# Patient Record
Sex: Female | Born: 1980 | Race: White | Hispanic: No | Marital: Single | Smoking: Current every day smoker
Health system: Southern US, Community
[De-identification: ages and names within clinical notes are randomized; demographics above are authoritative.]

## PROBLEM LIST (undated history)

## (undated) ENCOUNTER — Emergency Department (HOSPITAL_COMMUNITY): Discharge status: Against Medical Advice | Payer: Medicaid Other

## (undated) DIAGNOSIS — K469 Unspecified abdominal hernia without obstruction or gangrene: Secondary | ICD-10-CM

## (undated) DIAGNOSIS — O139 Gestational [pregnancy-induced] hypertension without significant proteinuria, unspecified trimester: Secondary | ICD-10-CM

## (undated) DIAGNOSIS — N200 Calculus of kidney: Secondary | ICD-10-CM

## (undated) DIAGNOSIS — K5900 Constipation, unspecified: Secondary | ICD-10-CM

## (undated) DIAGNOSIS — R51 Headache: Secondary | ICD-10-CM

## (undated) DIAGNOSIS — J189 Pneumonia, unspecified organism: Secondary | ICD-10-CM

## (undated) DIAGNOSIS — E162 Hypoglycemia, unspecified: Secondary | ICD-10-CM

## (undated) DIAGNOSIS — R0602 Shortness of breath: Secondary | ICD-10-CM

## (undated) DIAGNOSIS — G709 Myoneural disorder, unspecified: Secondary | ICD-10-CM

## (undated) HISTORY — PX: HERNIA REPAIR: SHX51

## (undated) HISTORY — PX: KIDNEY STONE SURGERY: SHX686

## (undated) HISTORY — PX: TUBAL LIGATION: SHX77

---
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
## 1999-04-16 ENCOUNTER — Inpatient Hospital Stay (HOSPITAL_COMMUNITY): Admission: AD | Admit: 1999-04-16 | Discharge: 1999-04-16 | Payer: Self-pay | Admitting: Obstetrics

## 1999-07-11 ENCOUNTER — Emergency Department (HOSPITAL_COMMUNITY): Admission: EM | Admit: 1999-07-11 | Discharge: 1999-07-11 | Payer: Self-pay | Admitting: Emergency Medicine

## 1999-07-12 ENCOUNTER — Emergency Department (HOSPITAL_COMMUNITY): Admission: EM | Admit: 1999-07-12 | Discharge: 1999-07-12 | Payer: Self-pay | Admitting: Emergency Medicine

## 1999-11-22 ENCOUNTER — Encounter: Payer: Self-pay | Admitting: Urology

## 1999-11-22 ENCOUNTER — Emergency Department (HOSPITAL_COMMUNITY): Admission: EM | Admit: 1999-11-22 | Discharge: 1999-11-22 | Payer: Self-pay | Admitting: Emergency Medicine

## 1999-11-24 ENCOUNTER — Ambulatory Visit (HOSPITAL_COMMUNITY): Admission: RE | Admit: 1999-11-24 | Discharge: 1999-11-24 | Payer: Self-pay | Admitting: Urology

## 1999-12-15 ENCOUNTER — Emergency Department (HOSPITAL_COMMUNITY): Admission: EM | Admit: 1999-12-15 | Discharge: 1999-12-15 | Payer: Self-pay | Admitting: Emergency Medicine

## 1999-12-18 ENCOUNTER — Emergency Department (HOSPITAL_COMMUNITY): Admission: EM | Admit: 1999-12-18 | Discharge: 1999-12-18 | Payer: Self-pay

## 2000-03-10 ENCOUNTER — Emergency Department (HOSPITAL_COMMUNITY): Admission: EM | Admit: 2000-03-10 | Discharge: 2000-03-10 | Payer: Self-pay | Admitting: Emergency Medicine

## 2000-06-14 ENCOUNTER — Emergency Department (HOSPITAL_COMMUNITY): Admission: EM | Admit: 2000-06-14 | Discharge: 2000-06-14 | Payer: Self-pay | Admitting: Emergency Medicine

## 2004-04-02 ENCOUNTER — Inpatient Hospital Stay (HOSPITAL_COMMUNITY): Admission: AD | Admit: 2004-04-02 | Discharge: 2004-04-02 | Payer: Self-pay | Admitting: Obstetrics & Gynecology

## 2004-04-17 ENCOUNTER — Inpatient Hospital Stay (HOSPITAL_COMMUNITY): Admission: AD | Admit: 2004-04-17 | Discharge: 2004-04-17 | Payer: Self-pay | Admitting: Obstetrics and Gynecology

## 2004-07-01 ENCOUNTER — Ambulatory Visit (HOSPITAL_COMMUNITY): Admission: RE | Admit: 2004-07-01 | Discharge: 2004-07-01 | Payer: Self-pay | Admitting: *Deleted

## 2004-08-21 ENCOUNTER — Emergency Department (HOSPITAL_COMMUNITY): Admission: EM | Admit: 2004-08-21 | Discharge: 2004-08-21 | Payer: Self-pay | Admitting: Emergency Medicine

## 2004-11-11 ENCOUNTER — Encounter (INDEPENDENT_AMBULATORY_CARE_PROVIDER_SITE_OTHER): Payer: Self-pay | Admitting: Specialist

## 2004-11-11 ENCOUNTER — Ambulatory Visit: Payer: Self-pay | Admitting: Obstetrics and Gynecology

## 2004-11-11 ENCOUNTER — Inpatient Hospital Stay (HOSPITAL_COMMUNITY): Admission: AD | Admit: 2004-11-11 | Discharge: 2004-11-14 | Payer: Self-pay | Admitting: Obstetrics & Gynecology

## 2004-11-17 ENCOUNTER — Inpatient Hospital Stay (HOSPITAL_COMMUNITY): Admission: AD | Admit: 2004-11-17 | Discharge: 2004-11-17 | Payer: Self-pay | Admitting: Obstetrics and Gynecology

## 2005-10-23 ENCOUNTER — Emergency Department (HOSPITAL_COMMUNITY): Admission: EM | Admit: 2005-10-23 | Discharge: 2005-10-23 | Payer: Self-pay | Admitting: Emergency Medicine

## 2007-04-23 ENCOUNTER — Emergency Department (HOSPITAL_COMMUNITY): Admission: EM | Admit: 2007-04-23 | Discharge: 2007-04-24 | Payer: Self-pay | Admitting: Emergency Medicine

## 2008-11-11 ENCOUNTER — Emergency Department (HOSPITAL_COMMUNITY): Admission: EM | Admit: 2008-11-11 | Discharge: 2008-11-11 | Payer: Self-pay | Admitting: Emergency Medicine

## 2009-02-01 ENCOUNTER — Ambulatory Visit: Payer: Self-pay | Admitting: Obstetrics and Gynecology

## 2009-02-01 ENCOUNTER — Inpatient Hospital Stay (HOSPITAL_COMMUNITY): Admission: AD | Admit: 2009-02-01 | Discharge: 2009-02-01 | Payer: Self-pay | Admitting: Obstetrics & Gynecology

## 2009-05-22 ENCOUNTER — Ambulatory Visit (HOSPITAL_COMMUNITY): Admission: RE | Admit: 2009-05-22 | Discharge: 2009-05-22 | Payer: Self-pay | Admitting: Family Medicine

## 2009-06-10 ENCOUNTER — Ambulatory Visit (HOSPITAL_COMMUNITY): Admission: RE | Admit: 2009-06-10 | Discharge: 2009-06-10 | Payer: Self-pay | Admitting: Obstetrics & Gynecology

## 2009-06-17 ENCOUNTER — Ambulatory Visit: Payer: Self-pay | Admitting: Obstetrics & Gynecology

## 2009-06-17 ENCOUNTER — Inpatient Hospital Stay (HOSPITAL_COMMUNITY): Admission: RE | Admit: 2009-06-17 | Discharge: 2009-06-20 | Payer: Self-pay | Admitting: Obstetrics & Gynecology

## 2009-06-24 ENCOUNTER — Ambulatory Visit: Payer: Self-pay | Admitting: Family

## 2009-06-24 ENCOUNTER — Inpatient Hospital Stay (HOSPITAL_COMMUNITY): Admission: AD | Admit: 2009-06-24 | Discharge: 2009-06-24 | Payer: Self-pay | Admitting: Obstetrics and Gynecology

## 2009-07-15 ENCOUNTER — Ambulatory Visit: Payer: Self-pay | Admitting: Family

## 2009-07-15 ENCOUNTER — Inpatient Hospital Stay (HOSPITAL_COMMUNITY): Admission: AD | Admit: 2009-07-15 | Discharge: 2009-07-16 | Payer: Self-pay | Admitting: Obstetrics & Gynecology

## 2009-09-26 ENCOUNTER — Ambulatory Visit: Payer: Self-pay | Admitting: Obstetrics and Gynecology

## 2009-09-26 ENCOUNTER — Inpatient Hospital Stay (HOSPITAL_COMMUNITY): Admission: AD | Admit: 2009-09-26 | Discharge: 2009-09-26 | Payer: Self-pay | Admitting: Obstetrics & Gynecology

## 2009-11-25 ENCOUNTER — Inpatient Hospital Stay (HOSPITAL_COMMUNITY): Admission: AD | Admit: 2009-11-25 | Discharge: 2009-11-25 | Payer: Self-pay | Admitting: Obstetrics and Gynecology

## 2010-03-16 LAB — WET PREP, GENITAL
Trich, Wet Prep: NONE SEEN
Yeast Wet Prep HPF POC: NONE SEEN

## 2010-03-16 LAB — URINALYSIS, ROUTINE W REFLEX MICROSCOPIC
Glucose, UA: NEGATIVE mg/dL
Specific Gravity, Urine: >1.03 — ABNORMAL HIGH (ref 1.005–1.030)
pH: 6 (ref 5.0–8.0)

## 2010-03-16 LAB — GC/CHLAMYDIA PROBE AMP, GENITAL
Chlamydia, DNA Probe: NEGATIVE
GC Probe Amp, Genital: NEGATIVE

## 2010-03-16 LAB — URINE CULTURE: Culture  Setup Time: 201111232255

## 2010-03-16 LAB — URINE MICROSCOPIC-ADD ON

## 2010-03-18 LAB — URINE CULTURE: Culture  Setup Time: 201109241345

## 2010-03-18 LAB — URINALYSIS, ROUTINE W REFLEX MICROSCOPIC
Ketones, ur: NEGATIVE mg/dL
Specific Gravity, Urine: 1.02 (ref 1.005–1.030)

## 2010-03-18 LAB — URINE MICROSCOPIC-ADD ON

## 2010-03-18 LAB — POCT PREGNANCY, URINE: Preg Test, Ur: NEGATIVE

## 2010-03-21 LAB — COMPREHENSIVE METABOLIC PANEL
AST: 11 U/L (ref 0–37)
Albumin: 3.6 g/dL (ref 3.5–5.2)
Alkaline Phosphatase: 129 U/L — ABNORMAL HIGH (ref 39–117)
Calcium: 8.5 mg/dL (ref 8.4–10.5)
Creatinine, Ser: 0.47 mg/dL (ref 0.4–1.2)
Glucose, Bld: 109 mg/dL — ABNORMAL HIGH (ref 70–99)
Potassium: 3.2 mEq/L — ABNORMAL LOW (ref 3.5–5.1)
Sodium: 134 mEq/L — ABNORMAL LOW (ref 135–145)
Total Bilirubin: 0.3 mg/dL (ref 0.3–1.2)

## 2010-03-21 LAB — CBC
HCT: 33.5 % — ABNORMAL LOW (ref 36.0–46.0)
HCT: 36.6 % (ref 36.0–46.0)
MCHC: 34.5 g/dL (ref 30.0–36.0)
MCHC: 34.6 g/dL (ref 30.0–36.0)
MCV: 96.1 fL (ref 78.0–100.0)
Platelets: 245 10*3/uL (ref 150–400)
Platelets: 289 10*3/uL (ref 150–400)
RBC: 3.49 MIL/uL — ABNORMAL LOW (ref 3.87–5.11)
RBC: 2.79 MIL/uL — ABNORMAL LOW (ref 3.87–5.11)
RBC: 3.81 MIL/uL — ABNORMAL LOW (ref 3.87–5.11)
RDW: 13 % (ref 11.5–15.5)
WBC: 13.6 10*3/uL — ABNORMAL HIGH (ref 4.0–10.5)

## 2010-03-21 LAB — RPR: RPR Ser Ql: NONREACTIVE

## 2010-03-21 LAB — URINALYSIS, ROUTINE W REFLEX MICROSCOPIC
Bilirubin Urine: NEGATIVE
Ketones, ur: NEGATIVE mg/dL
Protein, ur: 30 mg/dL — AB
Specific Gravity, Urine: 1.025 (ref 1.005–1.030)
Urobilinogen, UA: 1 mg/dL (ref 0.0–1.0)

## 2010-03-21 LAB — POCT PREGNANCY, URINE: Preg Test, Ur: NEGATIVE

## 2010-03-21 LAB — URINE CULTURE
Colony Count: 80000
Culture  Setup Time: 201107140432

## 2010-03-21 LAB — URINE MICROSCOPIC-ADD ON

## 2010-03-21 LAB — TYPE AND SCREEN
ABO/RH(D): O POS
Antibody Screen: NEGATIVE

## 2010-04-07 LAB — URINALYSIS, ROUTINE W REFLEX MICROSCOPIC
Glucose, UA: NEGATIVE mg/dL
Ketones, ur: NEGATIVE mg/dL
Protein, ur: NEGATIVE mg/dL

## 2010-04-07 LAB — CBC
HCT: 38.5 % (ref 36.0–46.0)
Hemoglobin: 13.4 g/dL (ref 12.0–15.0)
MCV: 90.4 fL (ref 78.0–100.0)
Platelets: 343 10*3/uL (ref 150–400)
RBC: 4.26 MIL/uL (ref 3.87–5.11)
WBC: 7 10*3/uL (ref 4.0–10.5)

## 2010-04-07 LAB — GC/CHLAMYDIA PROBE AMP, GENITAL: Chlamydia, DNA Probe: NEGATIVE

## 2010-04-07 LAB — BASIC METABOLIC PANEL
BUN: 5 mg/dL — ABNORMAL LOW (ref 6–23)
Chloride: 101 mEq/L (ref 96–112)
Potassium: 3.4 mEq/L — ABNORMAL LOW (ref 3.5–5.1)

## 2010-04-07 LAB — URINE MICROSCOPIC-ADD ON

## 2010-04-07 LAB — DIFFERENTIAL
Eosinophils Absolute: 0.1 10*3/uL (ref 0.0–0.7)
Eosinophils Relative: 1 % (ref 0–5)
Lymphs Abs: 2.3 10*3/uL (ref 0.7–4.0)
Monocytes Absolute: 0.3 10*3/uL (ref 0.1–1.0)
Monocytes Relative: 5 % (ref 3–12)

## 2010-04-07 LAB — WET PREP, GENITAL: Clue Cells Wet Prep HPF POC: NONE SEEN

## 2010-05-21 NOTE — Discharge Summary (Signed)
NAMEBERNEDA, Lindsey Sparks NO.:  [id number]   MEDICAL RECORD NO.:  [id number]          PATIENT TYPE:  INP   LOCATION:  9132                          FACILITY:  [facility]   PHYSICIAN:  Lesly Dukes, M.D. DATE OF BIRTH:  [birth year]   DATE OF ADMISSION:  11/11/2004  DATE OF DISCHARGE:  11/14/2004                                 DISCHARGE SUMMARY   DISCHARGE DIAGNOSES:  1.  Repeat low transverse Cesarean section for intrauterine pregnancy.  2.  Term pregnancy, viable female delivered via repeat low transverse Cesarean      section.  3.  Cephalopelvic disproportion.  4.  Attempted vaginal birth after Cesarean section but patient desired      repeat Cesarean section at 37-1/7 weeks.   DISCHARGE MEDICATIONS:  1.  Ibuprofen 600 mg one tablet p.o. q.6h. PRN pain.  2.  Percocet 5/325 one tablet p.o. q.6h. PRN pain.  3.  Prenatal vitamins one tablet daily.  4.  Colace 100 mg one tablet p.o. daily.   PROCEDURES:  Repeat low transverse Cesarean section performed on November 11, 2004 by Dr. Elinor Dodge.  Please see the operative report dated November 11, 2004 for further details.   BRIEF HOSPITAL COURSE:  The patient is a 30 year old G2, P1 at 37-1/7 weeks  who attempted VBAC delivery today who decided to have a repeat Cesarean  section in light of her previous cephalopelvic disproportion and her  __________  pelvis.  The patient was brought back for repeat low transverse  Cesarean section and went on to delivery of a viable female infant with  Apgar's of 7 and 8.  Placenta was manually delivered with three vessel cord  intact.  It was noted that the patient did have significant scarring in her  abdomen and she did not have a layer of peritoneum and she would likely  benefit from a vertical incision with her next Cesarean section.  The  delivery was also a vacuum-assisted Cesarean section delivery.  The patient  was then taken to the PACU and then recovered well and continued on  postpartum mother-baby unit.  The patient did well, tolerated pain fairly  well.  Staples were removed on discharge date and Steri-Strips were placed.  Her bleeding was decreased.  Her pain was well controlled with ibuprofen and  Percocet.  The patient is breastfeeding and will be using condoms for birth  control.  The patient will return to [facility] in six weeks for  postpartum checks.  The patient's vital signs are stable on the date of  discharge and her uterus is firm below the umbilicus.  The patient was also  counseled on her desire to want to have future pregnancies and that she  should understand the risks associated with the fact that her next Cesarean  section would possibly need to be using a vertical incision and that may  pose a lot of risk.  The patient was counseled on talking to her OB  physician prior to becoming pregnant on her next occasion.   The patient's Rubella status was non-immune and the patient will  be given an  injection of Rubella vaccine prior to discharge.  Syphilis was nonreactive,  HIV nonreactive, Gonorrhea and Chlamydia negative.  Hemoglobin was 9.5 with  hematocrit of 27.4 and platelet count 275,000 on date of discharge.  Blood  type was O __________  and hepatitis B surface antigen was negative.   The patient had pregnancy-induced hypertension labs drawn due to the fact  that she had some slight elevations in her blood pressure during this  hospitalization which revealed they were within normal limits.   FOLLOW UP:  The patient is to [facility] with [facility] weeks.      Barth Kirks, M.D.    ______________________________  Lesly Dukes, M.D.    MB/MEDQ  D:  11/14/2004  T:  11/14/2004  Job:  [id number]

## 2010-05-21 NOTE — Op Note (Signed)
NAMECHRISHAWN, KRING NO.:  [id number]   MEDICAL RECORD NO.:  [id number]          PATIENT TYPE:  INP   LOCATION:  9132                          [facility]ACILITY:  [facility]   PHYSICIAN:  Lesly Dukes, M.D. DATE OF BIRTH:  1980-08-01   DATE OF PROCEDURE:  11/11/2004  DATE OF DISCHARGE:                                 OPERATIVE REPORT   ADDENDUM:  report #[id number]   Patient did have significant scarring in her abdomen.  Patient did not have  a layer of peritoneum.  She would likely benefit from a vertical incision  with her next cesarean section.     ______________________________  August Saucer Merlene Morse, MD    ______________________________  Lesly Dukes, M.D.    ABC/MEDQ  D:  11/12/2004  T:  11/12/2004  Job:  [id number]

## 2010-05-21 NOTE — Op Note (Signed)
Lindsey Sparks, Lindsey Sparks             ACCOUNT NO.:  [id number]   MEDICAL RECORD NO.:  [id number]          PATIENT TYPE:  INP   LOCATION:  9132                          [facility]ILITY[facility]  [facility]   PHYSICIAN:  Phil D. Okey Dupre, M.D.     DATE OF BIRTH:  [birth year]   DATE OF PROCEDURE:  11/11/2004  DATE OF DISCHARGE:                                 OPERATIVE REPORT   PREOPERATIVE DIAGNOSES:  1.  Term intrauterine pregnancy at 37-1/7 weeks.  2.  Cephalopelvic disproportion.  3.  Attempted vaginal birth after cesarean section but the patient desired      repeat cesarean section.   POSTOPERATIVE DIAGNOSES:  1.  Term intrauterine pregnancy at 37-1/7 weeks.  2.  Cephalopelvic disproportion.  3.  Attempted vaginal birth after cesarean section but the patient desired      repeat cesarean section.   OPERATION/PROCEDURE:  Repeat low transverse cesarean section per  Pfannenstiel.   SURGEON:  Javier Glazier. Okey Dupre, M.D.   ASSISTANTKaroline Caldwell B. Merlene Morse, M.D.   ANESTHESIA:  Epidural.   COMPLICATIONS:  None.   ESTIMATED BLOOD LOSS:  850 mL.   IV FLUIDS:  2300 mL.   URINARY OUTPUT:  200 mL at the end of the procedure.   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  Maximum dilatation  1-2 cm.   FINDINGS:  A female infant in cephalic presentation.  Pediatrics present at  delivery.  Apgars 7 and 8.  Normal uterus, tubes and ovaries.   DESCRIPTION OF PROCEDURE:  The patient was taken to the operating room where  epidural anesthesia was found to be adequate.  The patient was prepped and  draped in the normal sterile fashion, placed in the dorsal supine position  with a leftward tilt.  A Pfannenstiel skin incision was then made with the  scalpel and carried through to the underlying layer of fascia.  The fascia  was incised in the midline and incision  extended laterally with the Mayo  scissors.  The inferior aspect of the fascial incision was then grasped with  the Kocher clamps, elevated and the underlying rectus muscle dissected off  bluntly.  Attention was then turned to the superior  aspect of the incision  which in a similar fashion was grasped, tented up with Kocher clamps and the  rectus muscle dissected off bluntly.  The rectus muscle was then separated  in the midline and the peritoneum identified, tented up and entered sharply  with the Metzenbaum scissors.  The peritoneal incision was extended  superiorly and inferiorly with good visualization of the bladder.  Bladder  blade was then reinserted into the vesicouterine peritoneum, identified and  grasped with pickups and entered sharply with the Metzenbaum scissors.  Incision was extended laterally and the bladder flap created digitally.  The  bladder blade was then reinserted in the lower uterine segment, incised in  the transverse fashion with the scalpel.  The  uterus was then extended  laterally.  Bladder blade was removed and the infant's head delivered with  assistance of the vacuum atraumatically.  The nose and mouth were suctioned  with the DeLee suction,  trapped  with the bulb syringe and the cord was  clamped and cut.  The infant was handed off to the awaiting pediatrician.  Cord blood was sent.  Cord PH was 7.2.  The placenta was removed manually.  The uterus was cleared of all clots and debris.  The uterine incision was  repaired in a running locked fashion.  The gutters were cleared of all clots  and the fascia was reapproximated in a running fashion.  The skin was closed  with staples.  The patient tolerated the procedure well.  Sponge, lap and  needle counts were correct x2.  The patient was taken to the recovery room  in stable condition.     ______________________________  August Saucer Merlene Morse, MD    ______________________________  Javier Glazier Okey Dupre, M.D.     ABC/MEDQ  D:  11/11/2004  T:  11/12/2004  Job:  [id number]

## 2010-05-21 NOTE — Op Note (Signed)
[facility]  Patient:    Lindsey Sparks, Lindsey Sparks                      MRN: [id number] Proc. Date: 11/24/99 Adm. Date:  [id number] Attending:  Laqueta Jean                           Operative Report  PREOPERATIVE DIAGNOSIS:  Distal left 9 mm ureterovesical junction impacted stone.  POSTOPERATIVE DIAGNOSIS:  Distal left 9 mm ureterovesical junction impacted stone.  OPERATION:  Cystourethroscopy and left retrograde pyelogram, left ureteroscopy, basket extraction of left ureteral stone.  SURGEON:  Sigmund I. Patsi Sears, M.D.  ANESTHESIA:  General (LMA).  DESCRIPTION OF PROCEDURE:  After preanesthesia, the patient was brought to the operating room and placed on the operating table in the dorsal supine position where general LMA anesthesia was introduced.  She was then replaced in the dorsal lithotomy position where the pubis was prepped with a Betadine solution and draped in the usual fashion.  Review of the x-ray shows this patient has a 9 mm left UV junction stone.  It was easily seen on fluoroscopy, and cystourethroscopy was accomplished which showed an edematous left ureteral orifice and tunnel.  Retrograde pyelogram was performed which showed the stone to be in the exact position as noted on previous x-ray.  Following this, a glidewire was passed into the renal pelvis and a 4 cm balloon dilator was inflated and held inflated for 3 minutes to balloon dilate the lower ureter.  After this, the ureteroscope was passed through the ureter, and the stone was manipulated into a basket and extracted. The stone was stuck in the lower ureter.  It was felt to have been there for quite some time.  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 Xylocaine jelly was placed in the ureter, and in an attempt to avoid placement of a double-J stent catheter, the patient was given  a dose of _____ and IV Toradol prior to awakening.   She was awakened and taken to the recovery room in good condition. DD:  11/24/99 TD:  11/25/99 Job: [id number]

## 2010-09-28 LAB — URINALYSIS, ROUTINE W REFLEX MICROSCOPIC
Bilirubin Urine: NEGATIVE
Ketones, ur: NEGATIVE
Nitrite: NEGATIVE
Protein, ur: NEGATIVE

## 2012-03-10 ENCOUNTER — Emergency Department (HOSPITAL_COMMUNITY)
Admission: EM | Admit: 2012-03-10 | Discharge: 2012-03-10 | Disposition: A | Discharge status: Home / Self Care | Payer: Medicaid Other | Attending: Emergency Medicine | Admitting: Emergency Medicine

## 2012-03-10 ENCOUNTER — Emergency Department (HOSPITAL_COMMUNITY): Payer: Medicaid Other

## 2012-03-10 ENCOUNTER — Encounter (HOSPITAL_COMMUNITY): Payer: Self-pay | Admitting: Emergency Medicine

## 2012-03-10 DIAGNOSIS — B9689 Other specified bacterial agents as the cause of diseases classified elsewhere: Secondary | ICD-10-CM

## 2012-03-10 DIAGNOSIS — Z3202 Encounter for pregnancy test, result negative: Secondary | ICD-10-CM | POA: Insufficient documentation

## 2012-03-10 DIAGNOSIS — F17200 Nicotine dependence, unspecified, uncomplicated: Secondary | ICD-10-CM | POA: Insufficient documentation

## 2012-03-10 DIAGNOSIS — R110 Nausea: Secondary | ICD-10-CM | POA: Insufficient documentation

## 2012-03-10 DIAGNOSIS — R109 Unspecified abdominal pain: Secondary | ICD-10-CM

## 2012-03-10 DIAGNOSIS — N760 Acute vaginitis: Secondary | ICD-10-CM | POA: Insufficient documentation

## 2012-03-10 LAB — COMPREHENSIVE METABOLIC PANEL
ALT: 10 U/L (ref 0–35)
CO2: 24 mEq/L (ref 19–32)
Calcium: 8.9 mg/dL (ref 8.4–10.5)
Creatinine, Ser: 0.47 mg/dL — ABNORMAL LOW (ref 0.50–1.10)
GFR calc Af Amer: >90 mL/min (ref >90)
GFR calc non Af Amer: >90 mL/min (ref >90)
Glucose, Bld: 102 mg/dL — ABNORMAL HIGH (ref 70–99)
Sodium: 136 mEq/L (ref 135–145)
Total Protein: 8.1 g/dL (ref 6.0–8.3)

## 2012-03-10 LAB — URINALYSIS, MICROSCOPIC ONLY
Hgb urine dipstick: NEGATIVE
Leukocytes, UA: NEGATIVE
Nitrite: NEGATIVE
Protein, ur: NEGATIVE mg/dL
Specific Gravity, Urine: 1.011 (ref 1.005–1.030)
Urobilinogen, UA: 1 mg/dL (ref 0.0–1.0)

## 2012-03-10 LAB — CBC WITH DIFFERENTIAL/PLATELET
Eosinophils Absolute: 0.1 10*3/uL (ref 0.0–0.7)
Eosinophils Relative: 2 % (ref 0–5)
HCT: 37.5 % (ref 36.0–46.0)
Lymphs Abs: 2.5 10*3/uL (ref 0.7–4.0)
MCH: 29.8 pg (ref 26.0–34.0)
MCV: 87.4 fL (ref 78.0–100.0)
Monocytes Absolute: 0.3 10*3/uL (ref 0.1–1.0)
Platelets: 346 10*3/uL (ref 150–400)
RBC: 4.29 MIL/uL (ref 3.87–5.11)
RDW: 15.2 % (ref 11.5–15.5)

## 2012-03-10 LAB — LIPASE, BLOOD: Lipase: 15 U/L (ref 11–59)

## 2012-03-10 LAB — POCT PREGNANCY, URINE: Preg Test, Ur: NEGATIVE

## 2012-03-10 LAB — WET PREP, GENITAL: Trich, Wet Prep: NONE SEEN

## 2012-03-10 MED ORDER — ONDANSETRON HCL 4 MG/2ML IJ SOLN
4.0000 mg | INTRAMUSCULAR | Status: DC | PRN
  Administered 2012-03-10: 4 mg via INTRAVENOUS
  Filled 2012-03-10: qty 2

## 2012-03-10 MED ORDER — IOHEXOL 300 MG/ML  SOLN
100.0000 mL | Freq: Once | INTRAMUSCULAR | Status: AC | PRN
  Administered 2012-03-10: 100 mL via INTRAVENOUS

## 2012-03-10 MED ORDER — MORPHINE SULFATE 4 MG/ML IJ SOLN
4.0000 mg | INTRAMUSCULAR | Status: DC | PRN
  Administered 2012-03-10: 4 mg via INTRAVENOUS
  Filled 2012-03-10: qty 1

## 2012-03-10 MED ORDER — IOHEXOL 300 MG/ML  SOLN
50.0000 mL | Freq: Once | INTRAMUSCULAR | Status: AC | PRN
  Administered 2012-03-10: 50 mL via ORAL

## 2012-03-10 MED ORDER — METRONIDAZOLE 500 MG PO TABS: 500.0000 mg | ORAL_TABLET | Freq: Two times a day (BID) | ORAL | Status: DC

## 2012-03-10 MED ORDER — HYDROCODONE-ACETAMINOPHEN 5-325 MG PO TABS: ORAL_TABLET | ORAL | Status: DC

## 2012-03-10 NOTE — ED Notes (Signed)
Pt states she has been having sharp lower abd pain x 2 months.  States that she has had a hard knot x 4 years.  Was told that it was a hernia.  States it has moved around in the four years.  States that she has been having intense abd pain x 4 days.  N/V.  States that she is throwing up "phlegm".  Pain 9/10.

## 2012-03-10 NOTE — ED Provider Notes (Signed)
History     CSN: [id number]  Arrival date & time 03/10/12  1242   First MD Initiated Contact with Patient 03/10/12 1715      Chief Complaint  Patient presents with  . Abdominal Pain     HPI Pt was seen at 1745.   Per pt, c/o gradual onset and persistence of constant lower abd "pain" for the past 7 years, worse over the past 2 months.  Pt states she has felt a "hard knot" in the area of her c-section scar for the past 4 years.  States the pain worsens when she "has to lift up my grandmother to take care of her."  Has been associated with nausea.  Denies dysuria/hematuria, no back/flank pain, no vomiting/diarrhea, no CP/SOB, no fevers, no vaginal bleeding/discharge.      History reviewed. No pertinent past medical history.  Past Surgical History  Procedure Laterality Date  . Cesarean section      History  Substance Use Topics  . Smoking status: Current Every Day Smoker -- 1.00 packs/day  . Smokeless tobacco: Not on file  . Alcohol Use: No      Review of Systems ROS: Statement: All systems negative except as marked or noted in the HPI; Constitutional: Negative for fever and chills. ; ; Eyes: Negative for eye pain, redness and discharge. ; ; ENMT: Negative for ear pain, hoarseness, nasal congestion, sinus pressure and sore throat. ; ; Cardiovascular: Negative for chest pain, palpitations, diaphoresis, dyspnea and peripheral edema. ; ; Respiratory: Negative for cough, wheezing and stridor. ; ; Gastrointestinal: +nausea, abd pain. Negative for vomiting, diarrhea, blood in stool, hematemesis, jaundice and rectal bleeding. . ; ; Genitourinary: Negative for dysuria, flank pain and hematuria. ; ; GYN:  No vaginal bleeding, no vaginal discharge, no vulvar pain.;; Musculoskeletal: Negative for back pain and neck pain. Negative for swelling and trauma.; ; Skin: Negative for pruritus, rash, abrasions, blisters, bruising and skin lesion.; ; Neuro: Negative for headache, lightheadedness and neck  stiffness. Negative for weakness, altered level of consciousness , altered mental status, extremity weakness, paresthesias, involuntary movement, seizure and syncope.       Allergies  Review of patient's allergies indicates no known allergies.  Home Medications   Current Outpatient Rx  Name  Route  Sig  Dispense  Refill  . ibuprofen (ADVIL,MOTRIN) 200 MG tablet   Oral   Take 200 mg by mouth every 6 (six) hours as needed for pain (pain).           BP 130/96  Pulse 88  Temp(Src) 98.6 F (37 C) (Oral)  Resp 18  SpO2 100%  LMP 02/11/2012  Physical Exam 1750: Physical examination:  Nursing notes reviewed; Vital signs and O2 SAT reviewed;  Constitutional: Well developed, Well nourished, Well hydrated, In no acute distress; Head:  Normocephalic, atraumatic; Eyes: EOMI, PERRL, No scleral icterus; ENMT: Mouth and pharynx normal, Mucous membranes moist; Neck: Supple, Full range of motion, No lymphadenopathy; Cardiovascular: Regular rate and rhythm, No gallop; Respiratory: Breath sounds clear & equal bilaterally, No rales, rhonchi, wheezes.  Speaking full sentences with ease, Normal respiratory effort/excursion; Chest: Nontender, Movement normal; Abdomen: Soft, Nontender, +small hard nodule to the left of her midline vertical c-section wound. No fluctuance, no erythema, no drainage.  Nondistended, Normal bowel sounds; Genitourinary: No CVA tenderness; Extremities: Pulses normal, No tenderness, No edema, No calf edema or asymmetry.; Neuro: AA&Ox3, Major CN grossly intact.  Speech clear. Gait upright and steady. Climbs on and off stretcher by herself.  No gross focal motor or sensory deficits in extremities.; Skin: Color normal, Warm, Dry.   ED Course  Procedures     MDM  MDM Reviewed: previous chart, nursing note and vitals Interpretation: labs and CT scan     Results for orders placed during the hospital encounter of 03/10/12  WET PREP, GENITAL      Result Value Range   Yeast Wet  Prep HPF POC NONE SEEN  NONE SEEN   Trich, Wet Prep NONE SEEN  NONE SEEN   Clue Cells Wet Prep HPF POC FEW (*) NONE SEEN   WBC, Wet Prep HPF POC NONE SEEN  NONE SEEN  CBC WITH DIFFERENTIAL      Result Value Range   WBC 5.4  4.0 - 10.5 K/uL   RBC 4.29  3.87 - 5.11 MIL/uL   Hemoglobin 12.8  12.0 - 15.0 g/dL   HCT 40.9  81.1 - 91.4 %   MCV 87.4  78.0 - 100.0 fL   MCH 29.8  26.0 - 34.0 pg   MCHC 34.1  30.0 - 36.0 g/dL   RDW 78.2  95.6 - 21.3 %   Platelets 346  150 - 400 K/uL   Neutrophils Relative 44  43 - 77 %   Neutro Abs 2.3  1.7 - 7.7 K/uL   Lymphocytes Relative 47 (*) 12 - 46 %   Lymphs Abs 2.5  0.7 - 4.0 K/uL   Monocytes Relative 6  3 - 12 %   Monocytes Absolute 0.3  0.1 - 1.0 K/uL   Eosinophils Relative 2  0 - 5 %   Eosinophils Absolute 0.1  0.0 - 0.7 K/uL   Basophils Relative 0  0 - 1 %   Basophils Absolute 0.0  0.0 - 0.1 K/uL  COMPREHENSIVE METABOLIC PANEL      Result Value Range   Sodium 136  135 - 145 mEq/L   Potassium 4.1  3.5 - 5.1 mEq/L   Chloride 102  96 - 112 mEq/L   CO2 24  19 - 32 mEq/L   Glucose, Bld 102 (*) 70 - 99 mg/dL   BUN 14  6 - 23 mg/dL   Creatinine, Ser 0.86 (*) 0.50 - 1.10 mg/dL   Calcium 8.9  8.4 - 57.8 mg/dL   Total Protein 8.1  6.0 - 8.3 g/dL   Albumin 4.2  3.5 - 5.2 g/dL   AST 12  0 - 37 U/L   ALT 10  0 - 35 U/L   Alkaline Phosphatase 116  39 - 117 U/L   Total Bilirubin 0.3  0.3 - 1.2 mg/dL   GFR calc non Af Amer >90  >90 mL/min   GFR calc Af Amer >90  >90 mL/min  LIPASE, BLOOD      Result Value Range   Lipase 15  11 - 59 U/L  URINALYSIS, MICROSCOPIC ONLY      Result Value Range   Color, Urine YELLOW  YELLOW   APPearance CLEAR  CLEAR   Specific Gravity, Urine 1.011  1.005 - 1.030   pH 6.5  5.0 - 8.0   Glucose, UA NEGATIVE  NEGATIVE mg/dL   Hgb urine dipstick NEGATIVE  NEGATIVE   Bilirubin Urine NEGATIVE  NEGATIVE   Ketones, ur 15 (*) NEGATIVE mg/dL   Protein, ur NEGATIVE  NEGATIVE mg/dL   Urobilinogen, UA 1.0  0.0 - 1.0 mg/dL    Nitrite NEGATIVE  NEGATIVE   Leukocytes, UA NEGATIVE  NEGATIVE   WBC, UA 0-2  <  3 WBC/hpf   Squamous Epithelial / LPF RARE  RARE  POCT PREGNANCY, URINE      Result Value Range   Preg Test, Ur NEGATIVE  NEGATIVE     Ct Abdomen Pelvis W Contrast 03/10/2012  *RADIOLOGY REPORT*  Clinical Data: Lambert Mody lower abdominal pain x2 months  CT ABDOMEN AND PELVIS WITH CONTRAST  Technique:  Multidetector CT imaging of the abdomen and pelvis was performed following the standard protocol during bolus administration of intravenous contrast.  Contrast: OMNIPAQUE IOHEXOL 300 MG/ML  SOLN, 50mL OMNIPAQUE IOHEXOL 300 MG/ML  SOLN  Comparison: Prior CT abdomen/pelvis 07/15/2009  Findings:  Lower Chest:  Stable 4 mm pulmonary nodule in the periphery of the left lower lobe dating back to 07/15/2009.  Nearly 2-year stability suggests that this is likely a benign granuloma.  The lung bases are otherwise clear.  The visualized cardiac structures are within normal limits for size.  No pericardial effusion.  Normal distal thoracic esophagus.  Abdomen: Unremarkable CT appearance of the stomach, duodenum, spleen, adrenal glands and pancreas.  Unremarkable appearance of the liver.  No focal hepatic lesion.  The hepatic and portal veins are patent. Gallbladder is unremarkable. No intra or extrahepatic biliary ductal dilatation.  Unremarkable appearance of the kidneys.  No nephrolithiasis or hydronephrosis.  No perinephric stranding, abnormal enhancement or solid lesion.  Normal-caliber large and small bowel throughout the abdomen.  No evidence of obstruction.  No significant colonic diverticular disease.  The terminal ileum is unremarkable.  Normal appendix in the right lower quadrant. No free fluid or adenopathy.  Pelvis:  The right adnexa is unremarkable.  There are two small enhancing foci anterior to the uterine fundus just subjacent to the anterior abdominal wall which may represent small fibroids, or potentially endometrial deposits  of the patient has had a prior hysterectomy.   Peripherally enhancing irregular cyst in the left ovary suggests a corpus luteum if the patient has recently lobulated.  Bilateral tubal ligation clips are present. No free fluid or adenopathy.  Bones/Soft Tissues: 12 x 12 mm enhancing soft tissue nodule within the medial aspect of the left rectus abdominus muscle just inferior to the umbilicus.  Correlation with prior imaging reveals a similar structure in this location without significant interval change.  No acute fracture or aggressive appearing lytic or blastic osseous lesion.  Vascular: No atherosclerotic vascular disease or focal vascular abnormality.  IMPRESSION:  1.  No acute abnormality in the abdomen or pelvis to explain the patient's clinical symptoms.  2.  Nonspecific 12 mm enhancing soft tissue nodule in the medial left rectus abdominal muscle just inferior to the umbilicus appears unchanged compared to prior imaging.  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  If the patient has a history of prior C-section, an endometrial implant in the rectus abdominous musculature could have a similar appearance. If clinically warranted, MRI of the pelvis could further evaluate.  3. Two small enhancing foci in the anterior wall of the uterus are in close approximation with the rectus abdominous enhancing nodule. These may represent two small uterine fibroids, or again if the patient is at prior C-section too small endometrial the process.  4. Enhancing left ovarian cyst likely represents a recently ruptured follicular cyst/corpus luteum of the patient is in the mid portion of their cycle.   Original Report Authenticated By: Malachy Moan, M.D.    [place] Art/ven Flow Abd Pelv Doppler 03/10/2012  *RADIOLOGY REPORT*  Clinical Data:  Pelvic pain.  TRANSABDOMINAL AND TRANSVAGINAL ULTRASOUND OF PELVIS DOPPLER ULTRASOUND OF OVARIES  Technique:  Both transabdominal and transvaginal  ultrasound examinations of the pelvis were performed. Transabdominal technique was performed for global imaging of the pelvis including uterus, ovaries, adnexal regions, and pelvic cul-de-sac.  It was necessary to proceed with endovaginal exam following the transabdominal exam to visualize the uterus and ovaries in greater detail.  Color and duplex Doppler ultrasound was utilized to evaluate blood flow to the ovaries.  Comparison:  CT of the abdomen and pelvis performed earlier today at 07:16 p.m., and pelvic ultrasound performed 06/10/2009  Findings:  Uterus:  Normal in size and appearance; measures 9.5 x 4.6 x 4.3 cm.  Endometrium:  Normal in thickness and appearance; measures 1.6 cm in thickness, reflecting the normal secretory phase of the cycle.  Right ovary: Normal appearance/no adnexal mass; measures 2.5 x 1.7 x 2.3 cm.  Left ovary:   Normal appearance/no adnexal mass; measures 3.3 x 2.3 x 3.1 cm.  Pulsed Doppler evaluation demonstrates normal low-resistance arterial and venous waveforms in both ovaries.  IMPRESSION: Normal exam.  No evidence of pelvic mass or other significant abnormality.  No sonographic evidence for ovarian torsion.   Original Report Authenticated By: Tonia Ghent, M.D.     2215:  Pt wants to go home now.  CT findings of abd soft tissue nodule unchanged from 2011.  No new/acute findings on workup today to explain pt's chronic pain. Will tx for BV.  GC/chlam pending.  Strongly encouraged to f/u with her OB/GYN.  Dx and testing d/w pt and family.  Questions answered.  Verb understanding, agreeable to d/c home with outpt f/u.        Laray Anger, DO 03/12/12 2125

## 2012-03-10 NOTE — ED Notes (Signed)
Bedside report received from previous RN 

## 2012-03-10 NOTE — ED Provider Notes (Signed)
Exam performed by Jaci Carrel,  exam chaperoned Date: 03/10/2012 Pelvic exam: normal external genitalia without evidence of trauma. VULVA: normal appearing vulva with no masses, tenderness or lesion. VAGINA: normal appearing vagina with normal color and discharge, no lesions. CERVIX: normal appearing cervix without lesions, cervical motion tenderness mild, cervical os closed with out purulent discharge; vaginal discharge - white and copious, Wet prep and DNA probe for chlamydia and GC obtained.   ADNEXA: normal adnexa in size,  no masses, right adnexa ttp.  UTERUS: uterus is normal size, shape, consistency and nontender.    Jaci Carrel, [place] 03/10/12 1842

## 2012-03-12 NOTE — ED Provider Notes (Signed)
Medical procedure (pelvic exam) only was performed by the non-physician practitioner and as supervising physician I was immediately available for consultation/collaboration. I personally evaluated the patient during the encounter; please see my previous note.      Laray Anger, DO 03/12/12 2128

## 2012-03-13 LAB — GC/CHLAMYDIA PROBE AMP
CT Probe RNA: NEGATIVE
GC Probe RNA: NEGATIVE

## 2012-04-30 ENCOUNTER — Encounter (HOSPITAL_COMMUNITY): Payer: Self-pay | Admitting: Advanced Practice Midwife

## 2012-04-30 ENCOUNTER — Inpatient Hospital Stay (HOSPITAL_COMMUNITY)
Admission: AD | Admit: 2012-04-30 | Discharge: 2012-04-30 | Disposition: A | Discharge status: Home / Self Care | Payer: Medicaid Other | Source: Ambulatory Visit | Attending: Obstetrics and Gynecology | Admitting: Obstetrics and Gynecology

## 2012-04-30 ENCOUNTER — Inpatient Hospital Stay (HOSPITAL_COMMUNITY): Payer: Medicaid Other

## 2012-04-30 ENCOUNTER — Encounter (HOSPITAL_COMMUNITY): Payer: Self-pay | Admitting: Emergency Medicine

## 2012-04-30 ENCOUNTER — Emergency Department (HOSPITAL_COMMUNITY)
Admission: EM | Admit: 2012-04-30 | Discharge: 2012-04-30 | Disposition: A | Discharge status: Home / Self Care | Payer: Medicaid Other | Source: Home / Self Care | Attending: Emergency Medicine | Admitting: Emergency Medicine

## 2012-04-30 ENCOUNTER — Other Ambulatory Visit (HOSPITAL_COMMUNITY)
Admission: RE | Admit: 2012-04-30 | Discharge: 2012-04-30 | Disposition: A | Discharge status: Home / Self Care | Payer: Medicaid Other | Source: Ambulatory Visit | Attending: Emergency Medicine | Admitting: Emergency Medicine

## 2012-04-30 DIAGNOSIS — N949 Unspecified condition associated with female genital organs and menstrual cycle: Secondary | ICD-10-CM | POA: Insufficient documentation

## 2012-04-30 DIAGNOSIS — M5380 Other specified dorsopathies, site unspecified: Secondary | ICD-10-CM | POA: Insufficient documentation

## 2012-04-30 DIAGNOSIS — R102 Pelvic and perineal pain: Secondary | ICD-10-CM

## 2012-04-30 DIAGNOSIS — R109 Unspecified abdominal pain: Secondary | ICD-10-CM | POA: Insufficient documentation

## 2012-04-30 DIAGNOSIS — Z113 Encounter for screening for infections with a predominantly sexual mode of transmission: Secondary | ICD-10-CM | POA: Insufficient documentation

## 2012-04-30 DIAGNOSIS — S39012D Strain of muscle, fascia and tendon of lower back, subsequent encounter: Secondary | ICD-10-CM

## 2012-04-30 DIAGNOSIS — K439 Ventral hernia without obstruction or gangrene: Secondary | ICD-10-CM | POA: Insufficient documentation

## 2012-04-30 DIAGNOSIS — N760 Acute vaginitis: Secondary | ICD-10-CM | POA: Insufficient documentation

## 2012-04-30 DIAGNOSIS — K432 Incisional hernia without obstruction or gangrene: Secondary | ICD-10-CM

## 2012-04-30 HISTORY — DX: Gestational (pregnancy-induced) hypertension without significant proteinuria, unspecified trimester: O13.9

## 2012-04-30 LAB — POCT PREGNANCY, URINE: Preg Test, Ur: NEGATIVE

## 2012-04-30 LAB — POCT URINALYSIS DIP (DEVICE)
Bilirubin Urine: NEGATIVE
Glucose, UA: NEGATIVE mg/dL
Ketones, ur: NEGATIVE mg/dL
Leukocytes, UA: NEGATIVE
Nitrite: NEGATIVE
pH: 6.5 (ref 5.0–8.0)

## 2012-04-30 MED ORDER — HYDROCODONE-ACETAMINOPHEN 5-325 MG PO TABS: ORAL_TABLET | ORAL | Status: DC

## 2012-04-30 MED ORDER — IBUPROFEN 800 MG PO TABS
800.0000 mg | ORAL_TABLET | Freq: Once | ORAL | Status: AC
  Administered 2012-04-30: 800 mg via ORAL

## 2012-04-30 MED ORDER — IBUPROFEN 800 MG PO TABS
ORAL_TABLET | ORAL | Status: AC
  Filled 2012-04-30: qty 1

## 2012-04-30 MED ORDER — CYCLOBENZAPRINE HCL 10 MG PO TABS: 10.0000 mg | ORAL_TABLET | Freq: Every evening | ORAL | Status: DC | PRN

## 2012-04-30 NOTE — MAU Provider Note (Signed)
History     CSN: [id number]  Arrival date and time: 04/30/12 1534   First Provider Initiated Contact with Patient 04/30/12 1720      Chief Complaint  Patient presents with  . Abdominal Pain   HPI This is a 32 y.o. female who presents from Urgent Care to have an Ultrasound.  She saw Dr Ladon Applebaum for long term pelvic pain and pain near her vertical C/S scar.  He feels she needs an ultrasound. She tells me most of the pain is at the site of her scar where she has been told she has a hernia. She states she saw a Careers adviser some time ago who said this was a hernia and she also had an umbilical hernia.  She did not have insurance then so "they told me to wait until I did".  Also has some low back spasm when lifting her grandmother. No fever or N/V/D.  RN Note: Neg CVA pain. Thought back pain was from kidneys. (family hx and hx of infections). Urine today was negative. Pt has been caring for grandma- lifting, discussed how it may be related to that. Also discussed how hernia can get worse with lifting.  OB History   Grav Para Term Preterm Abortions TAB SAB Ect Mult Living   3 3              Past Medical History  Diagnosis Date  . Pregnancy induced hypertension     Past Surgical History  Procedure Laterality Date  . Cesarean section      History reviewed. No pertinent family history.  History  Substance Use Topics  . Smoking status: Current Every Day Smoker -- 0.50 packs/day  . Smokeless tobacco: Not on file  . Alcohol Use: No    Allergies: No Known Allergies  Prescriptions prior to admission  Medication Sig Dispense Refill  . acetaminophen (TYLENOL) 500 MG tablet Take 1,000 mg by mouth every 6 (six) hours as needed for pain.      Marland Kitchen HYDROcodone-acetaminophen (NORCO/VICODIN) 5-325 MG per tablet Take 1 tablet by mouth every 6 (six) hours as needed for pain.      Marland Kitchen ibuprofen (ADVIL,MOTRIN) 200 MG tablet Take 200 mg by mouth every 6 (six) hours as needed for pain.       .  [DISCONTINUED] HYDROcodone-acetaminophen (NORCO/VICODIN) 5-325 MG per tablet 1 or 2 tabs PO q6 hours prn pain  20 tablet  0    Review of Systems  Constitutional: Negative for fever, chills and malaise/fatigue.  Gastrointestinal: Positive for abdominal pain. Negative for nausea, vomiting, diarrhea and constipation.  Genitourinary: Negative for dysuria.  Neurological: Negative for weakness.   Physical Exam   Blood pressure 129/81, pulse 64, temperature 97.9 F (36.6 C), temperature source Oral, resp. rate 18, height 4\' 10"  (1.473 m), weight 104 lb (47.174 kg), last menstrual period 04/09/2012.  Physical Exam  Constitutional: She is oriented to person, place, and time. She appears well-developed and well-nourished. No distress.  HENT:  Head: Normocephalic.  Cardiovascular: Normal rate.   Respiratory: Effort normal.  GI: Soft. She exhibits no distension and no mass. There is tenderness (tender over bulge next to vertical scar.  There is a low vertical well healed scar from umbilicus to pubis.). There is no rebound and no guarding.  Genitourinary: No vaginal discharge found.  Musculoskeletal: Normal range of motion. She exhibits tenderness (over paraspinous muscles around L3).  Neurological: She is alert and oriented to person, place, and time.  Skin: Skin is warm  and dry.  Psychiatric: She has a normal mood and affect.   Results for orders placed during the hospital encounter of 04/30/12 (from the past 24 hour(s))  POCT URINALYSIS DIP (DEVICE)     Status: None   Collection Time    04/30/12  1:54 PM      Result Value Range   Glucose, UA NEGATIVE  NEGATIVE mg/dL   Bilirubin Urine NEGATIVE  NEGATIVE   Ketones, ur NEGATIVE  NEGATIVE mg/dL   Specific Gravity, Urine 1.010  1.005 - 1.030   Hgb urine dipstick NEGATIVE  NEGATIVE   pH 6.5  5.0 - 8.0   Protein, ur NEGATIVE  NEGATIVE mg/dL   Urobilinogen, UA 0.2  0.0 - 1.0 mg/dL   Nitrite NEGATIVE  NEGATIVE   Leukocytes, UA NEGATIVE   NEGATIVE  POCT PREGNANCY, URINE     Status: None   Collection Time    04/30/12  1:59 PM      Result Value Range   Preg Test, Ur NEGATIVE  NEGATIVE    MAU Course  Procedures  MDM  US Pelvis Complete  04/30/2012  *RADIOLOGY REPORT*  Clinical Data: Pelvic pain.  TRANSABDOMINAL AND TRANSVAGINAL ULTRASOUND OF PELVIS Technique:  Both transabdominal and transvaginal ultrasound examinations of the pelvis were performed. Transabdominal technique was performed for global imaging of the pelvis including uterus, ovaries, adnexal regions, and pelvic cul-de-sac.  It was necessary to proceed with endovaginal exam following the transabdominal exam to visualize the uterus and endometrium.  Comparison:  Pelvic ultrasound 03/10/2012 and CT abdomen pelvis 03/10/2012  Findings:  Uterus: The uterus is partially retroflexed, and measures 8.9 x 3.8 x 5.1 cm.  No focal uterine mass is identified.  Endometrium: The endometrium measures 10 mm in thickness and has a normal appearance.  Right ovary:  The right ovary measures 2.2 x 1.7 x 3.2 cm and has a normal sonographic appearance.  No right ovarian or adnexal mass is identified.  Left ovary: At the left ovary measures 2.7 x 1.9 x 2.8 cm and has a normal sonographic appearance.  No ovarian or adnexal mass is seen on the left.  Other findings: There is a small amount of free pelvic fluid.  IMPRESSION: Normal study. No evidence of pelvic mass or other significant abnormality.   Original Report Authenticated By: Britta Mccreedy, M.D.     Assessment and Plan  A:  Small abdominal hernia vs scar tissue adjacent to vertical C/S scar       Back spasm secondary to lifting grandmother  P:  Referral to [facility] (will have our nurses call)       Rx vicodin and flexeril       Ice to back   [facility] 04/30/2012, 5:34 PM

## 2012-04-30 NOTE — ED Provider Notes (Signed)
History     CSN: [id number]  Arrival date & time 04/30/12  1148   First MD Initiated Contact with Patient 04/30/12 1339      Chief Complaint  Patient presents with  . Abdominal Pain    (Consider location/radiation/quality/duration/timing/severity/associated sxs/prior treatment) HPI Comments: Patient presents to urgent care describing for about a month or more she's been having pain in her lower stomach ( patient points to pelvic region), she also has a discrete area of focal tenderness to the left side of a surgical scar that is tender at touch and with movement. That she recognizes as her 2 distinctive areas of pain. Patient denies any fevers, nausea vomiting, denies any constitutional symptoms such as fevers, generalized malaise, unintentional weight loss. More so describes a for several months have had the sensation of " fullness and heaviness in her lower pelvic region" Denies any vaginal bleeding, or discharge and doesn't seem to be concerned for any potential STD exposure. She has been seen at the emergency department last month where she was diagnosed with a urinary tract infection. She describes that her symptoms are not gotten any better and that she has no urinary symptoms. She denies any burning pressure or increased frequency with urination. Feels her pain radiates towards her lower back mainly in the left side.  Patient is a 32 y.o. female presenting with abdominal pain. The history is provided by the patient.  Abdominal Pain Pain location:  Suprapubic Pain quality: aching and pressure   Pain radiates to:  Does not radiate Pain severity:  Moderate Onset quality:  Gradual Timing:  Constant Context: previous surgery and trauma   Context: not suspicious food intake   Relieved by:  Nothing Associated symptoms: no anorexia, no chest pain, no chills, no cough, no diarrhea, no dysuria, no fatigue, no fever, no hematuria, no nausea, no shortness of breath, no vaginal bleeding, no  vaginal discharge and no vomiting   Risk factors: not pregnant and no recent hospitalization     History reviewed. No pertinent past medical history.  Past Surgical History  Procedure Laterality Date  . Cesarean section      No family history on file.  History  Substance Use Topics  . Smoking status: Current Every Day Smoker -- 1.00 packs/day  . Smokeless tobacco: Not on file  . Alcohol Use: No    OB History   Grav Para Term Preterm Abortions TAB SAB Ect Mult Living                  Review of Systems  Constitutional: Negative for fever, chills, diaphoresis, activity change, appetite change and fatigue.  Respiratory: Negative for cough and shortness of breath.   Cardiovascular: Negative for chest pain.  Gastrointestinal: Positive for abdominal pain. Negative for nausea, vomiting, diarrhea, abdominal distention and anorexia.  Genitourinary: Negative for dysuria, hematuria, vaginal bleeding and vaginal discharge.  Musculoskeletal: Negative for back pain.    Allergies  Review of patient's allergies indicates no known allergies.  Home Medications   Current Outpatient Rx  Name  Route  Sig  Dispense  Refill  . HYDROcodone-acetaminophen (NORCO/VICODIN) 5-325 MG per tablet      1 or 2 tabs PO q6 hours prn pain   20 tablet   0   . ibuprofen (ADVIL,MOTRIN) 200 MG tablet   Oral   Take 200 mg by mouth every 6 (six) hours as needed for pain (pain).         . metroNIDAZOLE (FLAGYL) 500 MG  tablet   Oral   Take 1 tablet (500 mg total) by mouth 2 (two) times daily.   14 tablet   0     BP 125/78  Pulse 97  Temp(Src) 97.2 F (36.2 C) (Oral)  Resp 18  SpO2 100%  LMP 04/09/2012  Physical Exam  Nursing note and vitals reviewed. Constitutional: Vital signs are normal. She appears well-developed and well-nourished.  Non-toxic appearance. She does not have a sickly appearance. She does not appear ill. No distress.  Pulmonary/Chest: Effort normal.  Abdominal: Soft. She  exhibits no distension and no mass. There is no hepatosplenomegaly, splenomegaly or hepatomegaly. There is tenderness in the suprapubic area. There is no rigidity, no rebound, no guarding, no CVA tenderness, no tenderness at McBurney's point and negative Murphy's sign. No hernia. Hernia confirmed negative in the right inguinal area and confirmed negative in the left inguinal area.    Genitourinary: Cervix exhibits motion tenderness. Cervix exhibits no discharge and no friability. Right adnexum displays no mass, no tenderness and no fullness. Left adnexum displays tenderness and fullness. Left adnexum displays no mass. No vaginal discharge found.  Neurological: She is alert.  Skin: No rash noted. No erythema.    ED Course  Procedures (including critical care time)  Labs Reviewed  POCT URINALYSIS DIP (DEVICE)  POCT PREGNANCY, URINE  CERVICOVAGINAL ANCILLARY ONLY   No results found.   1. Pelvic pain     Negative pregnancy test Unremarkable urine dip with no signs of infection. Both tests done at [facility].  MDM  Patient describing recurrent pelvic pain for a couple months that have exacerbated in the last 2-3 days. Denies any vaginal bleeding or vaginal discharge. Pelvic exam was performed samples were obtained for STD screening. On pelvic bimanual exam it is noted that patient has predominantly left-sided reproducible pain uncertainty about uterus enlargement versus adnexal abnormality- [facility] the possibility to perform an acute stress vaginal or transabdominal ultrasound as patient has no primary care doctor. Spoke with provider at women[facility] ([facility]), patient will be seen there. Patient was instructed to go to [facility] hospital now for further evaluation.        Jimmie Molly, MD 04/30/12 [phone]

## 2012-04-30 NOTE — MAU Note (Signed)
Neg CVA pain.  Thought back pain was from kidneys.  (family hx and hx of infections). Urine today was negative.  Pt has been caring for grandma- lifting, discussed how it may be related to that.  Also discussed how hernia can get worse with lifting.

## 2012-04-30 NOTE — ED Notes (Signed)
Reports abdominal pain and back pain that started 3 days ago.  Denies urinary pain, no urinary symptoms.  Reports being seen at [facility] long ed a month ago and diagnosed with uti and treated with antibiotics.  Patient took a kit bought at the store that indicated patient had a uti.  Attempted to make appt with name on medicaid card, not taking any more medicaid adult patients.  No vaginal discharge.

## 2012-04-30 NOTE — MAU Note (Signed)
Lower abd pain for years- hurts everytime she is on period- usually  Stops with end of period but it has continued.  Has been bloated, took exlax- worked but still feels bloated and has pain. Lump noted beside vertical incision - was told at one time it was a hernia.  Has been there for 6 yrs- is getting bigger and hurting more.  Was at urgent care today, they advised her to come here- so that we could look and check inside.

## 2012-04-30 NOTE — ED Notes (Addendum)
patient reports pain medicine she took from home wearing off, reports abdomen hurting after physician exam, abdomen being palpated.  Notified dr Ladon Applebaum, received order for ibuprofen.

## 2012-05-01 DIAGNOSIS — N806 Endometriosis in cutaneous scar: Secondary | ICD-10-CM

## 2012-05-01 NOTE — MAU Provider Note (Signed)
Attestation of Attending Supervision of Advanced Practitioner (CNM/NP): Evaluation and management procedures were performed by the Advanced Practitioner under my supervision and collaboration.  I have reviewed the Advanced Practitioner's note and chart, and I agree with the management and plan.  Ariyanah Aguado 05/01/2012 4:32 AM

## 2012-05-02 ENCOUNTER — Telehealth: Payer: Self-pay | Admitting: *Deleted

## 2012-05-02 ENCOUNTER — Telehealth (HOSPITAL_COMMUNITY): Payer: Self-pay | Admitting: *Deleted

## 2012-05-02 MED ORDER — FLUCONAZOLE 150 MG PO TABS: 150.0000 mg | ORAL_TABLET | Freq: Once | ORAL | Status: DC

## 2012-05-02 MED ORDER — METRONIDAZOLE 500 MG PO TABS: 500.0000 mg | ORAL_TABLET | Freq: Two times a day (BID) | ORAL | Status: DC

## 2012-05-02 NOTE — ED Notes (Signed)
GC/Chlamydia neg., Affirm: Candida and Gardnerella pos., and Trich neg.  Message to Dr. Ladon Applebaum tx. Orders. Vassie Moselle 05/02/2012

## 2012-05-02 NOTE — ED Notes (Signed)
GC/Chlamydia neg.,  Affirm: Gardnerella and Candida pos. Trich neg.  Message sent to Dr. Ladon Applebaum and orders for Flagyl and Diflucan e-prescribed to the [facility] on [address], only if pt. has vaginal discharge and itchiness.  I called pt. and left a message to call. Lindsey Sparks 05/02/2012

## 2012-05-02 NOTE — Telephone Encounter (Signed)
Message copied by Gerome Apley on Wed May 02, 2012  4:28 PM ------      Message from: [place], [place] L      Created: Mon Apr 30, 2012  5:39 PM      Regarding: Need Surgery Referral       Pt was an OB patient of ours            Has possible abdominal hernia (has seen [facility] before but did not have insurance, they told her to get insurance and come back)            She said they told her she had two hernias.            CT from 2009 says questionable hernia of C/S scar            Can we refer her there for evaluation?  (not pregnant) ------

## 2012-05-02 NOTE — Telephone Encounter (Signed)
Called to schedule referral appt for pt to [facility] surgery and was informed that we can not make the referral appt.  Pt will need to contact [facility] provider on her medicaid card.  Called pt and left message that the referral appt that the provider Wynelle Bourgeois from [facility]U wanted [place] to make we were unable to do so because [facility] advised that the provider on her Medicaid card would have to do referral.  If she has any questions to please give [place] a call here at the clinics.

## 2012-05-03 ENCOUNTER — Telehealth (HOSPITAL_COMMUNITY): Payer: Self-pay | Admitting: *Deleted

## 2012-05-03 NOTE — ED Notes (Signed)
I called cell number and left a message.  I called home number and pt. was there.  Pt. verified x 2 and given results. Pt. Told that the doctor wants her to get medication only if she has symptoms.  I asked pt. if she had symptoms. She said she does have a vaginal discharge and itching.  Pt. told she needs Flagyl for the bacterial vaginosis and Diflucan for the Candida.  Pt. told her Rx.'s  are at [facility] on [address].  Pt. asked if these will go away.  I said yes the medicine should clear them both up.  Pt.'s questions about bacterial vaginosis answered.   Pt. instructed to no alcohol while taking the Flagyl. Pt. said she does not drink.  Pt. voiced understanding. Vassie Moselle 05/03/2012

## 2012-05-04 ENCOUNTER — Telehealth: Payer: Self-pay | Admitting: General Practice

## 2012-05-04 NOTE — Telephone Encounter (Signed)
Patient called and left message stating she saw Lindsey Sparks yesterday and would like to have a nurse call her back.

## 2012-05-07 NOTE — Telephone Encounter (Addendum)
Called pt and discussed her concern. She stated that we can make her surgical referral appt and give them the NPI # for her Medicaid PCP [facility]. TEL # [id number]. I called [facility] and was told that they have not seen this pt. They are not accepting new patients at this time over the age of 56. Pt will need to call her case worker and get her Medicaid changed. I was given their        NPI #[id number]  to be used for a 1 time visit only in order to make the pt's referral appt w/surgeon. I scheduled appt @ [facility] on 5/15 @ 1450 w/Dr. Magnus Ivan- pt needs to arrive at 1420. I called pt and was unable to leave message as her mailbox is full - will try again later. 5/8  1700 Called pt and informed her of appt scheduled @ [facility] on 5/15. She should arrive at 1420 and bring picture ID & Medicaid card. Pt was also advised that she needs to contact her case worker to change the PCP on her medicaid and [facility] is not accepting new pts. Pt states she has already done this. She will provide the new information to [facility] on Lindsey Sparks of her appt. Pt voiced undrstanding of all information given.

## 2012-05-17 ENCOUNTER — Ambulatory Visit (INDEPENDENT_AMBULATORY_CARE_PROVIDER_SITE_OTHER): Payer: Medicaid Other | Admitting: Surgery

## 2012-05-17 ENCOUNTER — Encounter (INDEPENDENT_AMBULATORY_CARE_PROVIDER_SITE_OTHER): Payer: Self-pay | Admitting: Surgery

## 2012-05-17 VITALS — BP 104/58 | HR 78 | Temp 97.4°F | Resp 18 | Ht 61.0 in | Wt 101.5 lb

## 2012-05-17 DIAGNOSIS — R1900 Intra-abdominal and pelvic swelling, mass and lump, unspecified site: Secondary | ICD-10-CM

## 2012-05-17 DIAGNOSIS — R222 Localized swelling, mass and lump, trunk: Secondary | ICD-10-CM | POA: Insufficient documentation

## 2012-05-17 NOTE — Progress Notes (Signed)
Patient ID: Lindsey Sparks, female   DOB: [birth year], 32 y.o.   MRN: [id number]  Chief Complaint  Patient presents with  . New Evaluation    evaluate incisional hernia    HPI Lindsey Sparks is a 32 y.o. female.   HPI This is a 32 year old female referred by Dr. Artelia Sparks for evaluation of an abdominal wall mass and abdominal wall pain. She reports that she has had a small mass in her lower abdominal wall left for midline for many years. It is becoming much more painful. She has had 3 C-sections. She's describes the pain as sharp and worse with motion. She has had no nausea or vomiting or obstructive symptoms. Past Medical History  Diagnosis Date  . Pregnancy induced hypertension     Past Surgical History  Procedure Laterality Date  . Cesarean section      History reviewed. No pertinent family history.  Social History History  Substance Use Topics  . Smoking status: Current Every Day Smoker -- 0.50 packs/day  . Smokeless tobacco: Not on file  . Alcohol Use: No    No Known Allergies  Current Outpatient Prescriptions  Medication Sig Dispense Refill  . cyclobenzaprine (FLEXERIL) 10 MG tablet Take 1 tablet (10 mg total) by mouth at bedtime as needed for muscle spasms.  30 tablet  0  . HYDROcodone-acetaminophen (NORCO/VICODIN) 5-325 MG per tablet 1 or 2 tabs PO q6 hours prn pain  20 tablet  0  . ibuprofen (ADVIL,MOTRIN) 200 MG tablet Take 200 mg by mouth every 6 (six) hours as needed for pain.       . fluconazole (DIFLUCAN) 150 MG tablet Take 1 tablet (150 mg total) by mouth once.  2 tablet  0  . metroNIDAZOLE (FLAGYL) 500 MG tablet Take 1 tablet (500 mg total) by mouth 2 (two) times daily.  14 tablet  0   No current facility-administered medications for this visit.    Review of Systems Review of Systems  Constitutional: Negative for fever, chills and unexpected weight change.  HENT: Negative for hearing loss, congestion, sore throat, trouble swallowing and voice  change.   Eyes: Negative for visual disturbance.  Respiratory: Negative for cough and wheezing.   Cardiovascular: Negative for chest pain, palpitations and leg swelling.  Gastrointestinal: Positive for abdominal pain. Negative for nausea, vomiting, diarrhea, constipation, blood in stool, abdominal distention and anal bleeding.  Genitourinary: Negative for hematuria, vaginal bleeding and difficulty urinating.  Musculoskeletal: Negative for arthralgias.  Skin: Negative for rash and wound.  Neurological: Negative for seizures, syncope and headaches.  Hematological: Negative for adenopathy. Does not bruise/bleed easily.  Psychiatric/Behavioral: Negative for confusion.    Blood pressure 104/58, pulse 78, temperature 97.4 F (36.3 C), resp. rate 18, height 5\' 1"  (1.549 m), weight 101 lb 8 oz (46.04 kg), last menstrual period 04/09/2012.  Physical Exam Physical Exam  Constitutional: She is oriented to person, place, and time. She appears well-developed and well-nourished. No distress.  HENT:  Head: Normocephalic and atraumatic.  Right Ear: External ear normal.  Left Ear: External ear normal.  Nose: Nose normal.  Mouth/Throat: Oropharynx is clear and moist.  Eyes: Conjunctivae are normal. Pupils are equal, round, and reactive to light. Right eye exhibits no discharge. Left eye exhibits no discharge. No scleral icterus.  Neck: Normal range of motion. Neck supple. No tracheal deviation present. No thyromegaly present.  Cardiovascular: Normal rate, regular rhythm, normal heart sounds and intact distal pulses.   No murmur heard. Pulmonary/Chest: Effort normal  and breath sounds normal. No respiratory distress. She has no wheezes. She has no rales.  Abdominal: Soft. Bowel sounds are normal.  There is a well-healed midline incision. There is weakness at the umbilicus. There is a 1 cm hard firm mass just left of the midline below the umbilicus  Musculoskeletal: Normal range of motion. She exhibits no  edema and no tenderness.  Lymphadenopathy:    She has no cervical adenopathy.  Neurological: She is alert and oriented to person, place, and time.  Skin: Skin is dry. No rash noted. She is not diaphoretic. No erythema.  Psychiatric: Her behavior is normal.    Data Reviewed I have reviewed the CAT scan demonstrating a mass which has been present since at least 2009  Assessment    Abdominal wall mass     Plan    I suspect this is an endometrial implant. Because of her symptoms, removal is recommended. I discussed risks of surgery with her. This includes but is not limited to bleeding, infection, recurrence, continued pain, et Karie Soda. I also discussed the potential need to use mesh to repair any defect. She understands and wishes to proceed. Surgery will be scheduled        Lindsey Sparks A 05/17/2012, 2:46 PM

## 2012-05-24 ENCOUNTER — Encounter (HOSPITAL_COMMUNITY): Payer: Self-pay

## 2012-05-24 ENCOUNTER — Encounter (HOSPITAL_COMMUNITY)
Admission: RE | Admit: 2012-05-24 | Discharge: 2012-05-24 | Disposition: A | Discharge status: Home / Self Care | Payer: Medicaid Other | Source: Ambulatory Visit | Attending: Surgery | Admitting: Surgery

## 2012-05-24 ENCOUNTER — Encounter (HOSPITAL_COMMUNITY)
Admission: RE | Admit: 2012-05-24 | Discharge: 2012-05-24 | Disposition: A | Discharge status: Home / Self Care | Payer: Medicaid Other | Source: Ambulatory Visit | Attending: Anesthesiology | Admitting: Anesthesiology

## 2012-05-24 HISTORY — DX: Constipation, unspecified: K59.00

## 2012-05-24 HISTORY — DX: Calculus of kidney: N20.0

## 2012-05-24 HISTORY — DX: Pneumonia, unspecified organism: J18.9

## 2012-05-24 HISTORY — DX: Myoneural disorder, unspecified: G70.9

## 2012-05-24 HISTORY — DX: Unspecified abdominal hernia without obstruction or gangrene: K46.9

## 2012-05-24 HISTORY — DX: Headache: R51

## 2012-05-24 HISTORY — DX: Shortness of breath: R06.02

## 2012-05-24 HISTORY — DX: Hypoglycemia, unspecified: E16.2

## 2012-05-24 LAB — BASIC METABOLIC PANEL
CO2: 24 mEq/L (ref 19–32)
Calcium: 9.4 mg/dL (ref 8.4–10.5)
Glucose, Bld: 82 mg/dL (ref 70–99)
Sodium: 138 mEq/L (ref 135–145)

## 2012-05-24 LAB — CBC
HCT: 38.5 % (ref 36.0–46.0)
MCH: 29.3 pg (ref 26.0–34.0)
MCV: 87.5 fL (ref 78.0–100.0)
Platelets: 322 10*3/uL (ref 150–400)
RBC: 4.4 MIL/uL (ref 3.87–5.11)

## 2012-05-24 NOTE — Pre-Procedure Instructions (Addendum)
Lindsey Sparks  05/24/2012   Your procedure is scheduled on: Thursday, May 31, 2012  Report to [facility] at 9:00 AM.  Call this number if you have problems the morning of surgery: [phone]   Remember:   Do not eat food or drink liquids after midnight.   Take these medicines the morning of surgery with A SIP OF WATER: metroNIDAZOLE (FLAGYL) 500 MG tablet             If Needed:HYDROcodone-acetaminophen (NORCO/VICODIN) 5-325 MG per tablet for pain             Stop taking Aspirin, and herbal medications. Do not take any NSAIDs ie: Ibuprofen, Advil, Motrin, Naproxen or any medication containing Aspirin.             Do not wear jewelry, make-up or nail polish.  Do not wear lotions, powders, or perfumes. You may wear deodorant.  Do not shave 48 hours prior to surgery.   Do not bring valuables to the hospital.  Contacts, dentures or bridgework may not be worn into surgery.  Leave suitcase in the car. After surgery it may be brought to your room.  For patients admitted to the hospital, checkout time is 11:00 AM the day of discharge.   Patients discharged the day of surgery will not be allowed to drive home.  Name and phone number of your driver:   Special Instructions: Shower using CHG 2 nights before surgery and the night before surgery.  If you shower the day of surgery use CHG.  Use special wash - you have one bottle of CHG for all showers.  You should use approximately 1/3 of the bottle for each shower.   Please read over the following fact sheets that you were given: Pain Booklet, Coughing and Deep Breathing and Surgical Site Infection Prevention

## 2012-05-24 NOTE — Progress Notes (Signed)
Pt denies SOB, chest pain, and being under the care of a cardiologist.  

## 2012-05-30 MED ORDER — CEFAZOLIN SODIUM-DEXTROSE 2-3 GM-% IV SOLR
2.0000 g | INTRAVENOUS | Status: DC
  Filled 2012-05-30: qty 50

## 2012-05-30 NOTE — H&P (Signed)
Patient ID: Lindsey Sparks, female DOB: [birth year], 32 y.o. MRN: [id number]  Chief Complaint   Patient presents with   .  New Evaluation     evaluate incisional hernia   HPI  Lindsey Sparks is a 32 y.o. female.  HPI  This is a 32 year old female referred by Dr. Artelia Laroche for evaluation of an abdominal wall mass and abdominal wall pain. She reports that she has had a small mass in her lower abdominal wall left for midline for many years. It is becoming much more painful. She has had 3 C-sections. She's describes the pain as sharp and worse with motion. She has had no nausea or vomiting or obstructive symptoms.  Past Medical History   Diagnosis  Date   .  Pregnancy induced hypertension     Past Surgical History   Procedure  Laterality  Date   .  Cesarean section     History reviewed. No pertinent family history.  Social History  History   Substance Use Topics   .  Smoking status:  Current Every Day Smoker -- 0.50 packs/day   .  Smokeless tobacco:  Not on file   .  Alcohol Use:  No   No Known Allergies  Current Outpatient Prescriptions   Medication  Sig  Dispense  Refill   .  cyclobenzaprine (FLEXERIL) 10 MG tablet  Take 1 tablet (10 mg total) by mouth at bedtime as needed for muscle spasms.  30 tablet  0   .  HYDROcodone-acetaminophen (NORCO/VICODIN) 5-325 MG per tablet  1 or 2 tabs PO q6 hours prn pain  20 tablet  0   .  ibuprofen (ADVIL,MOTRIN) 200 MG tablet  Take 200 mg by mouth every 6 (six) hours as needed for pain.     .  fluconazole (DIFLUCAN) 150 MG tablet  Take 1 tablet (150 mg total) by mouth once.  2 tablet  0   .  metroNIDAZOLE (FLAGYL) 500 MG tablet  Take 1 tablet (500 mg total) by mouth 2 (two) times daily.  14 tablet  0    No current facility-administered medications for this visit.   Review of Systems  Review of Systems  Constitutional: Negative for fever, chills and unexpected weight change.  HENT: Negative for hearing loss, congestion, sore throat, trouble  swallowing and voice change.  Eyes: Negative for visual disturbance.  Respiratory: Negative for cough and wheezing.  Cardiovascular: Negative for chest pain, palpitations and leg swelling.  Gastrointestinal: Positive for abdominal pain. Negative for nausea, vomiting, diarrhea, constipation, blood in stool, abdominal distention and anal bleeding.  Genitourinary: Negative for hematuria, vaginal bleeding and difficulty urinating.  Musculoskeletal: Negative for arthralgias.  Skin: Negative for rash and wound.  Neurological: Negative for seizures, syncope and headaches.  Hematological: Negative for adenopathy. Does not bruise/bleed easily.  Psychiatric/Behavioral: Negative for confusion.  Blood pressure 104/58, pulse 78, temperature 97.4 F (36.3 C), resp. rate 18, height 5\' 1"  (1.549 m), weight 101 lb 8 oz (46.04 kg), last menstrual period 04/09/2012.  Physical Exam  Physical Exam  Constitutional: She is oriented to person, place, and time. She appears well-developed and well-nourished. No distress.  HENT:  Head: Normocephalic and atraumatic.  Right Ear: External ear normal.  Left Ear: External ear normal.  Nose: Nose normal.  Mouth/Throat: Oropharynx is clear and moist.  Eyes: Conjunctivae are normal. Pupils are equal, round, and reactive to light. Right eye exhibits no discharge. Left eye exhibits no discharge. No scleral icterus.  Neck: Normal  range of motion. Neck supple. No tracheal deviation present. No thyromegaly present.  Cardiovascular: Normal rate, regular rhythm, normal heart sounds and intact distal pulses.  No murmur heard.  Pulmonary/Chest: Effort normal and breath sounds normal. No respiratory distress. She has no wheezes. She has no rales.  Abdominal: Soft. Bowel sounds are normal.  There is a well-healed midline incision. There is weakness at the umbilicus. There is a 1 cm hard firm mass just left of the midline below the umbilicus  Musculoskeletal: Normal range of motion.  She exhibits no edema and no tenderness.  Lymphadenopathy:  She has no cervical adenopathy.  Neurological: She is alert and oriented to person, place, and time.  Skin: Skin is dry. No rash noted. She is not diaphoretic. No erythema.  Psychiatric: Her behavior is normal.  Data Reviewed  I have reviewed the CAT scan demonstrating a mass which has been present since at least 2009  Assessment  Abdominal wall mass  Plan  I suspect this is an endometrial implant. Because of her symptoms, removal is recommended. I discussed risks of surgery with her. This includes but is not limited to bleeding, infection, recurrence, continued pain, et Karie Soda. I also discussed the potential need to use mesh to repair any defect. She understands and wishes to proceed. Surgery will be scheduled

## 2012-05-31 ENCOUNTER — Encounter (HOSPITAL_COMMUNITY): Payer: Self-pay | Admitting: Anesthesiology

## 2012-05-31 ENCOUNTER — Ambulatory Visit (HOSPITAL_COMMUNITY)
Admission: RE | Admit: 2012-05-31 | Discharge: 2012-05-31 | Disposition: A | Discharge status: Home / Self Care | Payer: Medicaid Other | Source: Ambulatory Visit | Attending: Surgery | Admitting: Surgery

## 2012-05-31 ENCOUNTER — Encounter (HOSPITAL_COMMUNITY): Payer: Self-pay | Admitting: *Deleted

## 2012-05-31 ENCOUNTER — Encounter (HOSPITAL_COMMUNITY)
Admission: RE | Disposition: A | Discharge status: Home / Self Care | Payer: Self-pay | Source: Ambulatory Visit | Attending: Surgery

## 2012-05-31 ENCOUNTER — Ambulatory Visit (HOSPITAL_COMMUNITY): Payer: Medicaid Other | Admitting: Anesthesiology

## 2012-05-31 DIAGNOSIS — D214 Benign neoplasm of connective and other soft tissue of abdomen: Secondary | ICD-10-CM | POA: Insufficient documentation

## 2012-05-31 DIAGNOSIS — N808 Other endometriosis: Secondary | ICD-10-CM | POA: Insufficient documentation

## 2012-05-31 DIAGNOSIS — Z79899 Other long term (current) drug therapy: Secondary | ICD-10-CM | POA: Insufficient documentation

## 2012-05-31 HISTORY — PX: MASS EXCISION: SHX2000

## 2012-05-31 SURGERY — EXCISION MASS
Anesthesia: General | Site: Abdomen | Wound class: Clean

## 2012-05-31 MED ORDER — MEPERIDINE HCL 25 MG/ML IJ SOLN: 6.2500 mg | INTRAMUSCULAR | Status: DC | PRN

## 2012-05-31 MED ORDER — PROPOFOL 10 MG/ML IV BOLUS
INTRAVENOUS | Status: DC | PRN
  Administered 2012-05-31: 170 mg via INTRAVENOUS

## 2012-05-31 MED ORDER — PROMETHAZINE HCL 25 MG/ML IJ SOLN: 6.2500 mg | INTRAMUSCULAR | Status: DC | PRN

## 2012-05-31 MED ORDER — ONDANSETRON HCL 4 MG/2ML IJ SOLN
INTRAMUSCULAR | Status: DC | PRN
  Administered 2012-05-31: 4 mg via INTRAVENOUS

## 2012-05-31 MED ORDER — HYDROCODONE-ACETAMINOPHEN 5-325 MG PO TABS: 1.0000 | ORAL_TABLET | ORAL | Status: DC | PRN

## 2012-05-31 MED ORDER — 0.9 % SODIUM CHLORIDE (POUR BTL) OPTIME
TOPICAL | Status: DC | PRN
  Administered 2012-05-31: 1000 mL

## 2012-05-31 MED ORDER — MIDAZOLAM HCL 5 MG/5ML IJ SOLN
INTRAMUSCULAR | Status: DC | PRN
  Administered 2012-05-31: 2 mg via INTRAVENOUS

## 2012-05-31 MED ORDER — LACTATED RINGERS IV SOLN
INTRAVENOUS | Status: DC | PRN
  Administered 2012-05-31: 11:00:00 via INTRAVENOUS

## 2012-05-31 MED ORDER — FENTANYL CITRATE 0.05 MG/ML IJ SOLN
INTRAMUSCULAR | Status: DC | PRN
  Administered 2012-05-31 (×3): 50 ug via INTRAVENOUS

## 2012-05-31 MED ORDER — OXYCODONE HCL 5 MG PO TABS
5.0000 mg | ORAL_TABLET | Freq: Once | ORAL | Status: AC | PRN
  Administered 2012-05-31: 5 mg via ORAL

## 2012-05-31 MED ORDER — LIDOCAINE HCL (CARDIAC) 20 MG/ML IV SOLN
INTRAVENOUS | Status: DC | PRN
  Administered 2012-05-31: 70 mg via INTRAVENOUS

## 2012-05-31 MED ORDER — OXYCODONE HCL 5 MG/5ML PO SOLN: 5.0000 mg | Freq: Once | ORAL | Status: AC | PRN

## 2012-05-31 MED ORDER — HYDROMORPHONE HCL PF 1 MG/ML IJ SOLN
INTRAMUSCULAR | Status: AC
  Filled 2012-05-31: qty 1

## 2012-05-31 MED ORDER — KETOROLAC TROMETHAMINE 30 MG/ML IJ SOLN
INTRAMUSCULAR | Status: AC
  Administered 2012-05-31: 30 mg
  Filled 2012-05-31: qty 1

## 2012-05-31 MED ORDER — BUPIVACAINE HCL (PF) 0.5 % IJ SOLN
INTRAMUSCULAR | Status: AC
  Filled 2012-05-31: qty 30

## 2012-05-31 MED ORDER — OXYCODONE HCL 5 MG PO TABS
ORAL_TABLET | ORAL | Status: AC
  Filled 2012-05-31: qty 1

## 2012-05-31 MED ORDER — HYDROMORPHONE HCL PF 1 MG/ML IJ SOLN
0.2500 mg | INTRAMUSCULAR | Status: DC | PRN
  Administered 2012-05-31: 0.5 mg via INTRAVENOUS

## 2012-05-31 MED ORDER — LACTATED RINGERS IV SOLN
INTRAVENOUS | Status: DC
  Administered 2012-05-31: 10:00:00 via INTRAVENOUS

## 2012-05-31 MED ORDER — BUPIVACAINE HCL 0.5 % IJ SOLN
INTRAMUSCULAR | Status: DC | PRN
  Administered 2012-05-31: 27 mL

## 2012-05-31 SURGICAL SUPPLY — 48 items
APL SKNCLS STERI-STRIP NONHPOA (GAUZE/BANDAGES/DRESSINGS) ×1
BENZOIN TINCTURE PRP APPL 2/3 (GAUZE/BANDAGES/DRESSINGS) ×2 IMPLANT
BLADE SURG 10 STRL SS (BLADE) ×2 IMPLANT
BLADE SURG 15 STRL LF DISP TIS (BLADE) ×1 IMPLANT
BLADE SURG 15 STRL SS (BLADE) ×2
CANISTER SUCTION 2500CC (MISCELLANEOUS) ×1 IMPLANT
CLOTH BEACON ORANGE TIMEOUT ST (SAFETY) ×2 IMPLANT
COVER SURGICAL LIGHT HANDLE (MISCELLANEOUS) ×2 IMPLANT
DECANTER SPIKE VIAL GLASS SM (MISCELLANEOUS) IMPLANT
DRAPE LAPAROSCOPIC ABDOMINAL (DRAPES) IMPLANT
DRAPE PED LAPAROTOMY (DRAPES) ×2 IMPLANT
ELECT CAUTERY BLADE 6.4 (BLADE) ×2 IMPLANT
ELECT REM PT RETURN 9FT ADLT (ELECTROSURGICAL) ×2
ELECTRODE REM PT RTRN 9FT ADLT (ELECTROSURGICAL) ×1 IMPLANT
GLOVE BIO SURGEON STRL SZ7.5 (GLOVE) ×1 IMPLANT
GLOVE BIOGEL PI IND STRL 6.5 (GLOVE) ×1 IMPLANT
GLOVE BIOGEL PI IND STRL 7.0 (GLOVE) ×1 IMPLANT
GLOVE BIOGEL PI IND STRL 7.5 (GLOVE) IMPLANT
GLOVE BIOGEL PI INDICATOR 6.5 (GLOVE) ×1
GLOVE BIOGEL PI INDICATOR 7.0 (GLOVE) ×1
GLOVE BIOGEL PI INDICATOR 7.5 (GLOVE) ×1
GLOVE ORTHOPEDIC STR SZ6.5 (GLOVE) ×2 IMPLANT
GLOVE SURG SIGNA 7.5 PF LTX (GLOVE) ×2 IMPLANT
GLOVE SURG SS PI 7.0 STRL IVOR (GLOVE) ×2 IMPLANT
GOWN PREVENTION PLUS XLARGE (GOWN DISPOSABLE) ×2 IMPLANT
GOWN STRL NON-REIN LRG LVL3 (GOWN DISPOSABLE) ×6 IMPLANT
KIT BASIN OR (CUSTOM PROCEDURE TRAY) ×2 IMPLANT
KIT ROOM TURNOVER OR (KITS) ×2 IMPLANT
NEEDLE HYPO 25X1 1.5 SAFETY (NEEDLE) ×2 IMPLANT
NS IRRIG 1000ML POUR BTL (IV SOLUTION) ×2 IMPLANT
PACK SURGICAL SETUP 50X90 (CUSTOM PROCEDURE TRAY) ×2 IMPLANT
PAD ARMBOARD 7.5X6 YLW CONV (MISCELLANEOUS) ×3 IMPLANT
PENCIL BUTTON HOLSTER BLD 10FT (ELECTRODE) ×2 IMPLANT
SPECIMEN JAR MEDIUM (MISCELLANEOUS) ×1 IMPLANT
SPECIMEN JAR SMALL (MISCELLANEOUS) ×2 IMPLANT
SPONGE GAUZE 4X4 12PLY (GAUZE/BANDAGES/DRESSINGS) IMPLANT
SPONGE LAP 18X18 X RAY DECT (DISPOSABLE) ×2 IMPLANT
STRIP CLOSURE SKIN 1/2X4 (GAUZE/BANDAGES/DRESSINGS) ×2 IMPLANT
SUT MNCRL AB 4-0 PS2 18 (SUTURE) ×2 IMPLANT
SUT NOVA NAB DX-16 0-1 5-0 T12 (SUTURE) ×1 IMPLANT
SUT VIC AB 3-0 SH 27 (SUTURE) ×2
SUT VIC AB 3-0 SH 27XBRD (SUTURE) ×1 IMPLANT
SYR BULB 3OZ (MISCELLANEOUS) ×4 IMPLANT
SYR CONTROL 10ML LL (SYRINGE) ×2 IMPLANT
TOWEL OR 17X24 6PK STRL BLUE (TOWEL DISPOSABLE) ×2 IMPLANT
TOWEL OR 17X26 10 PK STRL BLUE (TOWEL DISPOSABLE) ×2 IMPLANT
TUBE CONNECTING 12X1/4 (SUCTIONS) ×1 IMPLANT
YANKAUER SUCT BULB TIP NO VENT (SUCTIONS) ×2 IMPLANT

## 2012-05-31 NOTE — Anesthesia Preprocedure Evaluation (Signed)
Anesthesia Evaluation  Patient identified by MRN, date of birth, ID band Patient awake    Airway Mallampati: I  Neck ROM: Full    Dental  (+) Teeth Intact   Pulmonary shortness of breath,  breath sounds clear to auscultation        Cardiovascular hypertension, Rhythm:Regular Rate:Normal     Neuro/Psych  Headaches,    GI/Hepatic negative GI ROS, Neg liver ROS,   Endo/Other    Renal/GU      Musculoskeletal negative musculoskeletal ROS (+)   Abdominal   Peds  Hematology negative hematology ROS (+)   Anesthesia Other Findings   Reproductive/Obstetrics                           Anesthesia Physical Anesthesia Plan  ASA: II  Anesthesia Plan: General   Post-op Pain Management:    Induction: Intravenous  Airway Management Planned:   Additional Equipment:   Intra-op Plan:   Post-operative Plan:   Informed Consent:   Plan Discussed with:   Anesthesia Plan Comments:         Anesthesia Quick Evaluation

## 2012-05-31 NOTE — Transfer of Care (Signed)
Immediate Anesthesia Transfer of Care Note  Patient: Lindsey Sparks  Procedure(s) Performed: Procedure(s): EXCISION ABDOMINAL WALL MASS (N/A)  Patient Location: PACU  Anesthesia Type:General  Level of Consciousness: awake and alert   Airway & Oxygen Therapy: Patient Spontanous Breathing and Patient connected to nasal cannula oxygen  Post-op Assessment: Report given to PACU RN  Post vital signs: Reviewed and stable  Complications: No apparent anesthesia complications

## 2012-05-31 NOTE — Progress Notes (Signed)
Pt ambulated to Br and voided without difficulty

## 2012-05-31 NOTE — Anesthesia Postprocedure Evaluation (Signed)
  Anesthesia Post-op Note  Patient: Lindsey Sparks  Procedure(s) Performed: Procedure(s): EXCISION ABDOMINAL WALL MASS (N/A)  Patient Location: PACU  Anesthesia Type:General  Level of Consciousness: awake and sedated  Airway and Oxygen Therapy: Patient Spontanous Breathing  Post-op Pain: mild  Post-op Assessment: Post-op Vital signs reviewed  Post-op Vital Signs: stable  Complications: No apparent anesthesia complications

## 2012-05-31 NOTE — Interval H&P Note (Signed)
History and Physical Interval Note: no change in H and P  05/31/2012 11:11 AM  Lindsey Sparks  has presented today for surgery, with the diagnosis of abdominal mass  The various methods of treatment have been discussed with the patient and family. After consideration of risks, benefits and other options for treatment, the patient has consented to  Procedure(s): EXCISION ABDOMINAL WALL MASS (N/A) as a surgical intervention .  The patient's history has been reviewed, patient examined, no change in status, stable for surgery.  I have reviewed the patient's chart and labs.  Questions were answered to the patient's satisfaction.     Tieara Flitton A

## 2012-05-31 NOTE — Preoperative (Signed)
Beta Blockers   Reason not to administer Beta Blockers:Not Applicable 

## 2012-05-31 NOTE — Op Note (Signed)
EXCISION ABDOMINAL WALL MASS  Procedure Note  KATJA BLUE 05/31/2012   Pre-op Diagnosis: ABDOMINAL WALL MASS     Post-op Diagnosis: same  Procedure(s): EXCISION ABDOMINAL WALL MASS (4cm)  Surgeon(s): Shelly Rubenstein, MD  Anesthesia: General  Staff:  Circulator: Pauletta Browns, RN Relief Circulator: Simonne Maffucci, RN Relief Scrub: Simonne Maffucci, RN Scrub Person: Leighton Parody, CST; Carloyn Manner, RN  Estimated Blood Loss: Minimal               Specimens: sent to path         Findings: The patient was found to have a 4 cm abdominal wall mass in the anterior fascia which appeared consistent with a possible endometrioma  Procedure: The patient was brought to the operating room and identified as the correct patient. She was placed supine on the operating room table and general anesthesia was induced. Her abdomen was then prepped and draped in the usual sterile fashion. I anesthetized the skin at the patient's lower midline scar Marcaine. I made a longitudinal incision through the previous scar with a scalpel. I took this down into the subcutaneous tissue with the electrocautery. The patient had a firm, hard mass which was approximately 4 cm in size involving the anterior fascia of the abdominal wall. I excised the mass in its entirety with the cautery and sent to pathology for evaluation. I anesthetized the fascia and muscle as well as subcutaneous tissue with Marcaine. I reapproximated the anterior fascia with interrupted #1 Novafil sutures. I irrigated with saline. I then closed the subcutaneous tissue with interrupted 3-0 Vicryl sutures and closed the skin with a running 4-0 Monocryl suture. Steri-Strips, gauze, and tape were then applied. The patient tolerated the procedure well. All the counts were correct at the end of the procedure. The patient was then extubated in the operating room and taken in a stable condition to the recovery room. Teresea Donley A   Date:  05/31/2012  Time: 12:37 PM

## 2012-05-31 NOTE — Anesthesia Procedure Notes (Signed)
Procedure Name: LMA Insertion Date/Time: 05/31/2012 12:13 PM Performed by: Brien Mates DOBSON Pre-anesthesia Checklist: Patient identified, Emergency Drugs available, Suction available, Patient being monitored and Timeout performed Patient Re-evaluated:Patient Re-evaluated prior to inductionOxygen Delivery Method: Circle system utilized Preoxygenation: Pre-oxygenation with 100% oxygen Intubation Type: IV induction Ventilation: Mask ventilation without difficulty LMA: LMA inserted LMA Size: 4.0 Tube type: Oral Number of attempts: 1 Placement Confirmation: positive ETCO2 and breath sounds checked- equal and bilateral Tube secured with: Tape (upper dentures remove and given to circulating RN)

## 2012-06-01 ENCOUNTER — Telehealth (INDEPENDENT_AMBULATORY_CARE_PROVIDER_SITE_OTHER): Payer: Self-pay | Admitting: General Surgery

## 2012-06-01 ENCOUNTER — Encounter (HOSPITAL_COMMUNITY): Payer: Self-pay | Admitting: Surgery

## 2012-06-01 NOTE — Telephone Encounter (Signed)
LMOM for patient to call back and ask for Lindsey Sparks for apt to come back in to see Dr Magnus Ivan for a post op

## 2012-06-06 ENCOUNTER — Telehealth (INDEPENDENT_AMBULATORY_CARE_PROVIDER_SITE_OTHER): Payer: Self-pay

## 2012-06-06 NOTE — Telephone Encounter (Signed)
Post op appt 06/25/12 @420  pm per Pattricia Boss patient is aware

## 2012-06-25 ENCOUNTER — Encounter (INDEPENDENT_AMBULATORY_CARE_PROVIDER_SITE_OTHER): Payer: Medicaid Other | Admitting: Surgery

## 2012-07-12 ENCOUNTER — Encounter (INDEPENDENT_AMBULATORY_CARE_PROVIDER_SITE_OTHER): Payer: Self-pay | Admitting: Surgery

## 2013-01-04 ENCOUNTER — Encounter (HOSPITAL_COMMUNITY): Payer: Self-pay | Admitting: Emergency Medicine

## 2013-01-04 ENCOUNTER — Emergency Department (HOSPITAL_COMMUNITY)
Admission: EM | Admit: 2013-01-04 | Discharge: 2013-01-04 | Disposition: A | Discharge status: Home / Self Care | Payer: Medicaid Other | Attending: Emergency Medicine | Admitting: Emergency Medicine

## 2013-01-04 DIAGNOSIS — Z8719 Personal history of other diseases of the digestive system: Secondary | ICD-10-CM | POA: Insufficient documentation

## 2013-01-04 DIAGNOSIS — Z8701 Personal history of pneumonia (recurrent): Secondary | ICD-10-CM | POA: Insufficient documentation

## 2013-01-04 DIAGNOSIS — J111 Influenza due to unidentified influenza virus with other respiratory manifestations: Secondary | ICD-10-CM | POA: Insufficient documentation

## 2013-01-04 DIAGNOSIS — M545 Low back pain, unspecified: Secondary | ICD-10-CM | POA: Insufficient documentation

## 2013-01-04 DIAGNOSIS — R69 Illness, unspecified: Secondary | ICD-10-CM

## 2013-01-04 DIAGNOSIS — Z862 Personal history of diseases of the blood and blood-forming organs and certain disorders involving the immune mechanism: Secondary | ICD-10-CM | POA: Insufficient documentation

## 2013-01-04 DIAGNOSIS — R109 Unspecified abdominal pain: Secondary | ICD-10-CM | POA: Insufficient documentation

## 2013-01-04 DIAGNOSIS — Z8639 Personal history of other endocrine, nutritional and metabolic disease: Secondary | ICD-10-CM | POA: Insufficient documentation

## 2013-01-04 DIAGNOSIS — F17200 Nicotine dependence, unspecified, uncomplicated: Secondary | ICD-10-CM | POA: Insufficient documentation

## 2013-01-04 DIAGNOSIS — Z8669 Personal history of other diseases of the nervous system and sense organs: Secondary | ICD-10-CM | POA: Insufficient documentation

## 2013-01-04 DIAGNOSIS — Z87442 Personal history of urinary calculi: Secondary | ICD-10-CM | POA: Insufficient documentation

## 2013-01-04 LAB — URINALYSIS, ROUTINE W REFLEX MICROSCOPIC
Bilirubin Urine: NEGATIVE
Glucose, UA: NEGATIVE mg/dL
HGB URINE DIPSTICK: NEGATIVE
Ketones, ur: NEGATIVE mg/dL
LEUKOCYTES UA: NEGATIVE
NITRITE: NEGATIVE
PROTEIN: NEGATIVE mg/dL
SPECIFIC GRAVITY, URINE: 1.029 (ref 1.005–1.030)
UROBILINOGEN UA: 1 mg/dL (ref 0.0–1.0)
pH: 6 (ref 5.0–8.0)

## 2013-01-04 MED ORDER — OSELTAMIVIR PHOSPHATE 75 MG PO CAPS: 75.0000 mg | ORAL_CAPSULE | Freq: Two times a day (BID) | ORAL | Status: DC

## 2013-01-04 MED ORDER — SODIUM CHLORIDE 0.9 % IV BOLUS (SEPSIS)
1000.0000 mL | Freq: Once | INTRAVENOUS | Status: AC
  Administered 2013-01-04: 1000 mL via INTRAVENOUS

## 2013-01-04 MED ORDER — ACETAMINOPHEN 500 MG PO TABS
1000.0000 mg | ORAL_TABLET | Freq: Once | ORAL | Status: AC
  Administered 2013-01-04: 1000 mg via ORAL
  Filled 2013-01-04: qty 2

## 2013-01-04 NOTE — ED Notes (Signed)
Pt ambulated to nearby bathroom to provide urine sample.

## 2013-01-04 NOTE — ED Provider Notes (Signed)
CSN: [id number]     Arrival date & time 01/04/13  1303 History   First MD Initiated Contact with Patient 01/04/13 1314     Chief Complaint  Patient presents with  . Fever  . Back Pain  . Abdominal Pain  . Cough   (Consider location/radiation/quality/duration/timing/severity/associated sxs/prior Treatment) Patient is a 33 y.o. female presenting with fever, back pain, abdominal pain, and cough.  Fever Temp source:  Subjective Severity:  Moderate Onset quality:  Gradual Duration:  1 day Timing:  Constant Progression:  Worsening Chronicity:  New Relieved by:  Nothing Worsened by:  Nothing tried Associated symptoms: cough and myalgias   Associated symptoms: no chest pain, no congestion, no diarrhea, no nausea and no vomiting   Associated symptoms comment:  Low abdominal cramping, low back pain Back Pain Associated symptoms: abdominal pain and fever   Associated symptoms: no chest pain   Abdominal Pain Associated symptoms: cough and fever   Associated symptoms: no chest pain, no diarrhea, no nausea, no shortness of breath and no vomiting   Cough Associated symptoms: fever and myalgias   Associated symptoms: no chest pain and no shortness of breath     Past Medical History  Diagnosis Date  . Pregnancy induced hypertension   . Shortness of breath     Hx: of with exertion  . Pneumonia   . Hypoglycemia     Hx: of   . Stone, kidney     Hx:of  . Hernia     Hx: of  . Headache(784.0)     Hx: of migraines  . Neuromuscular disorder     hx: of tremor  . Constipation     Hx: of   Past Surgical History  Procedure Laterality Date  . Cesarean section    . Kidney stone surgery      Hx: of  . Tubal ligation    . Mass excision N/A 05/31/2012    Procedure: EXCISION ABDOMINAL WALL MASS;  Surgeon: Harl Bowie, MD;  Location: [facility];  Service: General;  Laterality: N/A;  . Hernia repair     No family history on file. History  Substance Use Topics  . Smoking status: Current  Every Day Smoker -- 0.50 packs/day  . Smokeless tobacco: Not on file  . Alcohol Use: Yes     Comment: occasional   OB History   Grav Para Term Preterm Abortions TAB SAB Ect Mult Living   3 3             Review of Systems  Constitutional: Positive for fever.  HENT: Negative for congestion.   Respiratory: Positive for cough. Negative for shortness of breath.   Cardiovascular: Negative for chest pain.  Gastrointestinal: Positive for abdominal pain. Negative for nausea, vomiting and diarrhea.  Musculoskeletal: Positive for back pain and myalgias.  All other systems reviewed and are negative.    Allergies  Review of patient's allergies indicates no known allergies.  Home Medications  No current outpatient prescriptions on file. BP 122/69  Pulse 125  Temp(Src) 99.6 F (37.6 C) (Oral)  Resp 20  SpO2 96%  LMP 12/09/2012 Physical Exam  Nursing note and vitals reviewed. Constitutional: She is oriented to person, place, and time. She appears well-developed and well-nourished. No distress.  Appears febrile  HENT:  Head: Normocephalic and atraumatic.  Mouth/Throat: Oropharynx is clear and moist.  Eyes: Conjunctivae are normal. Pupils are equal, round, and reactive to light. No scleral icterus.  Neck: Neck supple.  Cardiovascular: Normal  rate, regular rhythm, normal heart sounds and intact distal pulses.   No murmur heard. Pulmonary/Chest: Effort normal and breath sounds normal. No stridor. No respiratory distress. She has no decreased breath sounds. She has no wheezes. She has no rhonchi. She has no rales.  Abdominal: Soft. Bowel sounds are normal. She exhibits no distension. There is tenderness in the suprapubic area. There is no rigidity, no rebound and no guarding.  Musculoskeletal: Normal range of motion.  Neurological: She is alert and oriented to person, place, and time.  Skin: Skin is warm and dry. No rash noted.  Psychiatric: She has a normal mood and affect. Her behavior  is normal.    ED Course  Procedures (including critical care time) Labs Review Labs Reviewed  URINALYSIS, ROUTINE W REFLEX MICROSCOPIC   Imaging Review No results found.  EKG Interpretation   None       MDM   1. Influenza-like illness    33 yo female with fevers and myalgias starting today.  Appeared febrile, but nontoxic.  No respiratory distress and had clear lung sounds.  Given Tylenol with improved her symptoms somewhat.  Complained of some cramping low abdominal discomfort and low back pain.  However, UA was negative and her abdominal pain and tenderness resolved completely after Tylenol.  Her symptoms are most consistent with influenza.  Prescribed oseltamivir and recommended supportive care.  Given return precautions, particularly involving return of abdominal pain.      Houston Siren, MD 01/04/13 [phone]

## 2013-01-04 NOTE — ED Notes (Signed)
Pt reports no pain in abd at present time and slight pain of 3 in lower back.

## 2013-01-04 NOTE — Discharge Instructions (Signed)

## 2013-01-04 NOTE — ED Notes (Signed)
Pt encouraged to void when able. 

## 2013-01-04 NOTE — ED Notes (Addendum)
Per pt noticed right sided swollen lymph node on neck yesterday. Pt reports it is gone now. Pt states when awaking up today had lower back pain, abd pain, cough, and hot/cold flashes. Pt reports n/v since today. Pt reports one episode of vomiting today. Pt has hx of hernia repair.

## 2013-02-12 ENCOUNTER — Encounter (HOSPITAL_COMMUNITY): Payer: Self-pay | Admitting: Emergency Medicine

## 2013-02-12 ENCOUNTER — Emergency Department (HOSPITAL_COMMUNITY)
Admission: EM | Admit: 2013-02-12 | Discharge: 2013-02-12 | Disposition: A | Discharge status: Home / Self Care | Payer: Medicaid Other | Attending: Emergency Medicine | Admitting: Emergency Medicine

## 2013-02-12 DIAGNOSIS — Z8719 Personal history of other diseases of the digestive system: Secondary | ICD-10-CM | POA: Insufficient documentation

## 2013-02-12 DIAGNOSIS — Z8701 Personal history of pneumonia (recurrent): Secondary | ICD-10-CM | POA: Insufficient documentation

## 2013-02-12 DIAGNOSIS — Z8639 Personal history of other endocrine, nutritional and metabolic disease: Secondary | ICD-10-CM | POA: Insufficient documentation

## 2013-02-12 DIAGNOSIS — Z8679 Personal history of other diseases of the circulatory system: Secondary | ICD-10-CM | POA: Insufficient documentation

## 2013-02-12 DIAGNOSIS — Z87442 Personal history of urinary calculi: Secondary | ICD-10-CM | POA: Insufficient documentation

## 2013-02-12 DIAGNOSIS — F17200 Nicotine dependence, unspecified, uncomplicated: Secondary | ICD-10-CM | POA: Insufficient documentation

## 2013-02-12 DIAGNOSIS — Z862 Personal history of diseases of the blood and blood-forming organs and certain disorders involving the immune mechanism: Secondary | ICD-10-CM | POA: Insufficient documentation

## 2013-02-12 DIAGNOSIS — Z791 Long term (current) use of non-steroidal anti-inflammatories (NSAID): Secondary | ICD-10-CM | POA: Insufficient documentation

## 2013-02-12 DIAGNOSIS — M542 Cervicalgia: Secondary | ICD-10-CM | POA: Insufficient documentation

## 2013-02-12 DIAGNOSIS — J020 Streptococcal pharyngitis: Secondary | ICD-10-CM | POA: Insufficient documentation

## 2013-02-12 DIAGNOSIS — Z8669 Personal history of other diseases of the nervous system and sense organs: Secondary | ICD-10-CM | POA: Insufficient documentation

## 2013-02-12 LAB — RAPID STREP SCREEN (MED CTR MEBANE ONLY): STREPTOCOCCUS, GROUP A SCREEN (DIRECT): POSITIVE — AB

## 2013-02-12 MED ORDER — NAPROXEN 500 MG PO TABS: 500.0000 mg | ORAL_TABLET | Freq: Two times a day (BID) | ORAL | Status: DC

## 2013-02-12 MED ORDER — HYDROCODONE-ACETAMINOPHEN 7.5-325 MG/15ML PO SOLN: 15.0000 mL | Freq: Three times a day (TID) | ORAL | Status: DC | PRN

## 2013-02-12 MED ORDER — PENICILLIN G BENZATHINE 1200000 UNIT/2ML IM SUSP
1.2000 10*6.[IU] | Freq: Once | INTRAMUSCULAR | Status: AC
  Administered 2013-02-12: 1.2 10*6.[IU] via INTRAMUSCULAR
  Filled 2013-02-12: qty 2

## 2013-02-12 NOTE — Discharge Instructions (Signed)
Please call your doctor for a followup appointment within 24-48 hours. When you talk to your doctor please let them know that you were seen in the emergency department and have them acquire all of your records so that they can discuss the findings with you and formulate a treatment plan to fully care for your new and ongoing problems. ° °

## 2013-02-12 NOTE — ED Notes (Signed)
Pt states that she began having a sore throat last pm; pt reports that her 2 kids just recently had strep throat and is concerned she has the same thing

## 2013-02-12 NOTE — ED Provider Notes (Signed)
CSN: [id number]     Arrival date & time 02/12/13  0527 History   First MD Initiated Contact with Patient 02/12/13 0542     Chief Complaint  Patient presents with  . Sore Throat     (Consider location/radiation/quality/duration/timing/severity/associated sxs/prior Treatment) HPI Comments: 33 year old female, present with one day of sore throat, describes bilateral neck pain right greater than left. Has 2 children with strep throat. Symptoms are persistent, moderate, gradually worsening. No associated vomiting or coughing  Patient is a 33 y.o. female presenting with pharyngitis. The history is provided by the patient.  Sore Throat    Past Medical History  Diagnosis Date  . Pregnancy induced hypertension   . Shortness of breath     Hx: of with exertion  . Pneumonia   . Hypoglycemia     Hx: of   . Stone, kidney     Hx:of  . Hernia     Hx: of  . Headache(784.0)     Hx: of migraines  . Neuromuscular disorder     hx: of tremor  . Constipation     Hx: of   Past Surgical History  Procedure Laterality Date  . Cesarean section    . Kidney stone surgery      Hx: of  . Tubal ligation    . Mass excision N/A 05/31/2012    Procedure: EXCISION ABDOMINAL WALL MASS;  Surgeon: Harl Bowie, MD;  Location: [facility];  Service: General;  Laterality: N/A;  . Hernia repair     No family history on file. History  Substance Use Topics  . Smoking status: Current Every Day Smoker -- 0.50 packs/day  . Smokeless tobacco: Not on file  . Alcohol Use: Yes     Comment: occasional   OB History   Grav Para Term Preterm Abortions TAB SAB Ect Mult Living   3 3             Review of Systems  Constitutional: Negative for fever.  HENT: Positive for sore throat.       Allergies  Review of patient's allergies indicates no known allergies.  Home Medications   Current Outpatient Rx  Name  Route  Sig  Dispense  Refill  . HYDROcodone-acetaminophen (HYCET) 7.5-325 mg/15 ml solution    Oral   Take 15 mLs by mouth every 8 (eight) hours as needed for moderate pain.   120 mL   0   . naproxen (NAPROSYN) 500 MG tablet   Oral   Take 1 tablet (500 mg total) by mouth 2 (two) times daily with a meal.   30 tablet   0    BP 116/67  Pulse 84  Temp(Src) 97.9 F (36.6 C) (Oral)  Resp 18  SpO2 98%  LMP 02/09/2013 Physical Exam  Nursing note and vitals reviewed. Constitutional: She appears well-developed and well-nourished. No distress.  HENT:  Head: Normocephalic and atraumatic.  Mouth/Throat: Oropharynx is clear and moist. No oropharyngeal exudate.  Bilateral mild tonsillar hypertrophy, erythema present, exudate present, no asymmetry, no change in phonation, no trismus or torticollis  Eyes: Conjunctivae are normal. No scleral icterus.  Neck: Normal range of motion. Neck supple. No thyromegaly present.  Cardiovascular: Normal rate and regular rhythm.   Pulmonary/Chest: Effort normal and breath sounds normal.  Lymphadenopathy:    She has cervical adenopathy.  Neurological: She is alert.  Skin: Skin is warm and dry. No rash noted. She is not diaphoretic.    ED Course  Procedures (including critical  care time) Labs Review Labs Reviewed  RAPID STREP SCREEN - Abnormal; Notable for the following:    Streptococcus, Group A Screen (Direct) POSITIVE (*)    All other components within normal limits   Imaging Review No results found.  EKG Interpretation   None       MDM   Final diagnoses:  Strep pharyngitis    Well-appearing, the patient agrees to intramuscular penicillin, stable for discharge   Meds given in ED:  Medications  penicillin g benzathine (BICILLIN LA) 1200000 UNIT/2ML injection 1.2 Million Units (not administered)    New Prescriptions   HYDROCODONE-ACETAMINOPHEN (HYCET) 7.5-325 MG/15 ML SOLUTION    Take 15 mLs by mouth every 8 (eight) hours as needed for moderate pain.   NAPROXEN (NAPROSYN) 500 MG TABLET    Take 1 tablet (500 mg total) by  mouth 2 (two) times daily with a meal.        Johnna Acosta, MD 02/12/13 [phone]

## 2013-08-07 ENCOUNTER — Emergency Department (HOSPITAL_COMMUNITY)
Admission: EM | Admit: 2013-08-07 | Discharge: 2013-08-07 | Disposition: A | Discharge status: Home / Self Care | Payer: Medicaid Other | Attending: Emergency Medicine | Admitting: Emergency Medicine

## 2013-08-07 ENCOUNTER — Encounter (HOSPITAL_COMMUNITY): Payer: Self-pay | Admitting: Emergency Medicine

## 2013-08-07 ENCOUNTER — Emergency Department (HOSPITAL_COMMUNITY): Payer: Medicaid Other

## 2013-08-07 DIAGNOSIS — Z8639 Personal history of other endocrine, nutritional and metabolic disease: Secondary | ICD-10-CM | POA: Insufficient documentation

## 2013-08-07 DIAGNOSIS — Z87442 Personal history of urinary calculi: Secondary | ICD-10-CM | POA: Insufficient documentation

## 2013-08-07 DIAGNOSIS — Y9389 Activity, other specified: Secondary | ICD-10-CM | POA: Diagnosis not present

## 2013-08-07 DIAGNOSIS — Z8701 Personal history of pneumonia (recurrent): Secondary | ICD-10-CM | POA: Insufficient documentation

## 2013-08-07 DIAGNOSIS — Y929 Unspecified place or not applicable: Secondary | ICD-10-CM | POA: Diagnosis not present

## 2013-08-07 DIAGNOSIS — Z8719 Personal history of other diseases of the digestive system: Secondary | ICD-10-CM | POA: Diagnosis not present

## 2013-08-07 DIAGNOSIS — F17200 Nicotine dependence, unspecified, uncomplicated: Secondary | ICD-10-CM | POA: Insufficient documentation

## 2013-08-07 DIAGNOSIS — W268XXA Contact with other sharp object(s), not elsewhere classified, initial encounter: Secondary | ICD-10-CM | POA: Diagnosis not present

## 2013-08-07 DIAGNOSIS — Z8669 Personal history of other diseases of the nervous system and sense organs: Secondary | ICD-10-CM | POA: Diagnosis not present

## 2013-08-07 DIAGNOSIS — IMO0002 Reserved for concepts with insufficient information to code with codable children: Secondary | ICD-10-CM | POA: Insufficient documentation

## 2013-08-07 DIAGNOSIS — Z862 Personal history of diseases of the blood and blood-forming organs and certain disorders involving the immune mechanism: Secondary | ICD-10-CM | POA: Insufficient documentation

## 2013-08-07 DIAGNOSIS — S90851A Superficial foreign body, right foot, initial encounter: Secondary | ICD-10-CM

## 2013-08-07 MED ORDER — CEPHALEXIN 500 MG PO CAPS: 500.0000 mg | ORAL_CAPSULE | Freq: Four times a day (QID) | ORAL | Status: DC

## 2013-08-07 MED ORDER — NAPROXEN 500 MG PO TABS: 500.0000 mg | ORAL_TABLET | Freq: Two times a day (BID) | ORAL | Status: DC

## 2013-08-07 NOTE — Discharge Instructions (Signed)
You may have a small wood splinter that is too small to remove. Please take antibiotic only if you notice signs of infection.  Soak foot in warm water with epson salt several times daily.  Follow up with [facility] if your pain persists despite treatment.    Wood Splinters Wood splinters need to be removed because they can cause skin irritation and infection. If they are close to the surface, splinters can usually be removed easily. Deep splinters may be hard to locate and need treatment by a surgeon. SPLINTER REMOVAL Removal of splinters by your caregiver is considered a surgical procedure.   The area is carefully cleaned. You may require a small amount of anesthesia (medicine injected near the splinter to numb the tissue and lessen pain). After the splinter is removed, the area will be cleaned again. A bandage is applied.  If your splinter is under a fingernail or toenail, then a small section of the nail may need to be removed. As long as the splinter did not extend to the base of the nail, the nail usually grows back normally.  A splinter that is deeper, more contaminated, or that gets near a structure such as a bone, nerve or blood vessel may need to be removed by a Psychologist, sport and exercise.  You may need special X-rays or scans if the splinter is hard to locate.  Every attempt is made to remove the entire splinter. However, small particles may remain. Tell your caregiver if you feel that a part of the splinter was left behind. HOME CARE INSTRUCTIONS   Keep the injured area high up (elevated).  Use the injured area as little as possible.  Keep the injured area clean and dry. Follow any directions from your caregiver.  Keep any follow-up or wound check appointments. You might need a tetanus shot now if:  You have no idea when you had the last one.  You have never had a tetanus shot before.  The injured area had dirt in it. Even if you have already removed the splinter, call your  caregiver to get a tetanus shot if you need one.  If you need a tetanus shot, and you decide not to get one, there is a rare chance of getting tetanus. Sickness from tetanus can be serious. If you did get a tetanus shot, your arm may swell, get red and warm to the touch at the shot site. This is common and not a problem. SEEK MEDICAL CARE IF:   A splinter has been removed, but you are not better in a day or two.  You develop a temperature.  Signs of infection develop such as:  Redness, swelling or pus around the wound.  Red streaks spreading back from your wound towards your body. Document Released: 01/28/2004 Document Revised: 05/06/2013 Document Reviewed: 12/31/2007 [facility] Patient Information 2015 [place], [place]. This information is not intended to replace advice given to you by your health care provider. Make sure you discuss any questions you have with your health care provider.

## 2013-08-07 NOTE — ED Notes (Signed)
Pt stated that she "dug" into her foot 2 days ago to try and get splinter out. She stated that it started to bleed so she stopped cutting

## 2013-08-07 NOTE — ED Notes (Signed)
Pt states splinter in foot on July 4th weekend.  Pt has been trying to get it out.  Pt states pain is getting worse.

## 2013-08-07 NOTE — ED Provider Notes (Signed)
CSN: [id number]     Arrival date & time 08/07/13  1653 History  This chart was scribed for Domenic Moras, PA-C, working with Ephraim Hamburger, MD by Steva Colder, ED Scribe. The patient was seen in room WTR7/WTR7 at 5:52 PM.     Chief Complaint  Patient presents with  . Foot Injury    The history is provided by the patient. No language interpreter was used.   HPI Comments: Lindsey Sparks is a 33 y.o. female who presents to the Emergency Department complaining of foot injury onset 1 month ago. She states that she had a splinter in her foot on July 4th weekend. She states that she thought that she got all the splinter out. She states that she used nail clippers to remove it. She states that since then she has been trying to get it out. She states that the pain is getting worse. She states that she had to walk on the side of her foot today. She denies any other associated symptoms. No hx of DM. She states that she does not remember when her last tetanus shot was. She denies being pregnant.  Past Medical History  Diagnosis Date  . Pregnancy induced hypertension   . Shortness of breath     Hx: of with exertion  . Pneumonia   . Hypoglycemia     Hx: of   . Stone, kidney     Hx:of  . Hernia     Hx: of  . Headache(784.0)     Hx: of migraines  . Neuromuscular disorder     hx: of tremor  . Constipation     Hx: of   Past Surgical History  Procedure Laterality Date  . Cesarean section    . Kidney stone surgery      Hx: of  . Tubal ligation    . Mass excision N/A 05/31/2012    Procedure: EXCISION ABDOMINAL WALL MASS;  Surgeon: Harl Zehra Rucci, MD;  Location: [facility];  Service: General;  Laterality: N/A;  . Hernia repair     History reviewed. No pertinent family history. History  Substance Use Topics  . Smoking status: Current Every Day Smoker -- 0.50 packs/day  . Smokeless tobacco: Not on Sparks  . Alcohol Use: Yes     Comment: occasional   OB History   Grav Para Term Preterm  Abortions TAB SAB Ect Mult Living   3 3             Review of Systems  Constitutional: Negative for fever.  Musculoskeletal: Positive for myalgias (right foot pain).  Skin: Positive for wound (splinter in the right foot). Negative for rash.    Allergies  Review of patient's allergies indicates no known allergies.  Home Medications   Prior to Admission medications   Medication Sig Start Date End Date Taking? Authorizing Provider  HYDROcodone-acetaminophen (HYCET) 7.5-325 mg/15 ml solution Take 15 mLs by mouth every 8 (eight) hours as needed for moderate pain. 02/12/13   Johnna Acosta, MD  naproxen (NAPROSYN) 500 MG tablet Take 1 tablet (500 mg total) by mouth 2 (two) times daily with a meal. 02/12/13   Johnna Acosta, MD   BP 133/81  Pulse 85  Temp(Src) 98.1 F (36.7 C) (Oral)  Resp 18  SpO2 100%  LMP 07/31/2013  Physical Exam  Nursing note and vitals reviewed. Constitutional: She is oriented to person, place, and time. She appears well-developed and well-nourished. No distress.  HENT:  Head: Normocephalic  and atraumatic.  Eyes: EOM are normal.  Neck: Neck supple. No tracheal deviation present.  Cardiovascular: Normal rate.   Pulmonary/Chest: Effort normal. No respiratory distress.  Musculoskeletal: Normal range of motion.  Neurological: She is alert and oriented to person, place, and time.  Skin: Skin is warm and dry. No erythema.  Right foot: callus buildup to the right foot proximal to the fourth toe in ball of foot. Tender to palpitation. No FB noted. No erythema or abscess.   Psychiatric: She has a normal mood and affect. Her behavior is normal.    ED Course  Procedures (including critical care time) DIAGNOSTIC STUDIES: Oxygen Saturation is 100% on room air, normal by my interpretation.    COORDINATION OF CARE: 5:59 PM-Discussed why trying to extract a splinter from the foot would not be reasonable with the pt. Discussed a Rx for an Abx and to not fill it until  there is pus coming out of the affected area. Discussed treatments of neosporin and soaking the foot in warm water and epsom salts. Discussed treatment plan which includes Naproxin with pt at bedside and pt agreed to plan.   Labs Review Labs Reviewed - No data to display  Imaging Review Dg Foot Complete Right  08/07/2013   CLINICAL DATA:  FOOT INJURY  EXAM: RIGHT FOOT COMPLETE - 3+ VIEW  COMPARISON:  None.  FINDINGS: There is no evidence of fracture or dislocation. There is no evidence of arthropathy or other focal bone abnormality. Soft tissues are unremarkable.  IMPRESSION: Negative.   Electronically Signed   By: Arne Cleveland M.D.   On: 08/07/2013 17:45     EKG Interpretation None      MDM   Final diagnoses:  Foreign body in foot, right, initial encounter    BP 133/81  Pulse 85  Temp(Src) 98.1 F (36.7 C) (Oral)  Resp 18  SpO2 100%  LMP 07/31/2013   I personally performed the services described in this documentation, which was scribed in my presence. The recorded information has been reviewed and is accurate.    Domenic Moras, PA-C 08/07/13 [phone]

## 2013-08-07 NOTE — ED Provider Notes (Signed)
Medical screening examination/treatment/procedure(s) were performed by non-physician practitioner and as supervising physician I was immediately available for consultation/collaboration.   EKG Interpretation None        Ephraim Hamburger, MD 08/07/13 2222

## 2013-10-22 ENCOUNTER — Emergency Department (HOSPITAL_COMMUNITY): Payer: Medicaid Other

## 2013-10-22 ENCOUNTER — Encounter (HOSPITAL_COMMUNITY): Payer: Self-pay | Admitting: Emergency Medicine

## 2013-10-22 ENCOUNTER — Emergency Department (HOSPITAL_COMMUNITY)
Admission: EM | Admit: 2013-10-22 | Discharge: 2013-10-22 | Disposition: A | Discharge status: Home / Self Care | Payer: Medicaid Other | Attending: Emergency Medicine | Admitting: Emergency Medicine

## 2013-10-22 DIAGNOSIS — M25511 Pain in right shoulder: Secondary | ICD-10-CM

## 2013-10-22 DIAGNOSIS — S299XXA Unspecified injury of thorax, initial encounter: Secondary | ICD-10-CM | POA: Insufficient documentation

## 2013-10-22 DIAGNOSIS — Y92410 Unspecified street and highway as the place of occurrence of the external cause: Secondary | ICD-10-CM | POA: Diagnosis not present

## 2013-10-22 DIAGNOSIS — Z8639 Personal history of other endocrine, nutritional and metabolic disease: Secondary | ICD-10-CM | POA: Insufficient documentation

## 2013-10-22 DIAGNOSIS — Z791 Long term (current) use of non-steroidal anti-inflammatories (NSAID): Secondary | ICD-10-CM | POA: Diagnosis not present

## 2013-10-22 DIAGNOSIS — Z8669 Personal history of other diseases of the nervous system and sense organs: Secondary | ICD-10-CM | POA: Insufficient documentation

## 2013-10-22 DIAGNOSIS — Z8701 Personal history of pneumonia (recurrent): Secondary | ICD-10-CM | POA: Insufficient documentation

## 2013-10-22 DIAGNOSIS — Y9389 Activity, other specified: Secondary | ICD-10-CM | POA: Insufficient documentation

## 2013-10-22 DIAGNOSIS — S59901A Unspecified injury of right elbow, initial encounter: Secondary | ICD-10-CM | POA: Insufficient documentation

## 2013-10-22 DIAGNOSIS — M25521 Pain in right elbow: Secondary | ICD-10-CM

## 2013-10-22 DIAGNOSIS — S199XXA Unspecified injury of neck, initial encounter: Secondary | ICD-10-CM | POA: Diagnosis not present

## 2013-10-22 DIAGNOSIS — S4991XA Unspecified injury of right shoulder and upper arm, initial encounter: Secondary | ICD-10-CM | POA: Diagnosis present

## 2013-10-22 DIAGNOSIS — M62838 Other muscle spasm: Secondary | ICD-10-CM

## 2013-10-22 DIAGNOSIS — Z87442 Personal history of urinary calculi: Secondary | ICD-10-CM | POA: Diagnosis not present

## 2013-10-22 DIAGNOSIS — Z720 Tobacco use: Secondary | ICD-10-CM | POA: Diagnosis not present

## 2013-10-22 DIAGNOSIS — Z792 Long term (current) use of antibiotics: Secondary | ICD-10-CM | POA: Insufficient documentation

## 2013-10-22 DIAGNOSIS — Z8719 Personal history of other diseases of the digestive system: Secondary | ICD-10-CM | POA: Diagnosis not present

## 2013-10-22 MED ORDER — OXYCODONE-ACETAMINOPHEN 5-325 MG PO TABS: 1.0000 | ORAL_TABLET | ORAL | Status: DC | PRN

## 2013-10-22 MED ORDER — METHOCARBAMOL 500 MG PO TABS: 500.0000 mg | ORAL_TABLET | Freq: Two times a day (BID) | ORAL | Status: DC

## 2013-10-22 MED ORDER — OXYCODONE-ACETAMINOPHEN 5-325 MG PO TABS
1.0000 | ORAL_TABLET | Freq: Once | ORAL | Status: AC
  Administered 2013-10-22: 1 via ORAL
  Filled 2013-10-22: qty 1

## 2013-10-22 NOTE — ED Provider Notes (Signed)
Medical screening examination/treatment/procedure(s) were performed by non-physician practitioner and as supervising physician I was immediately available for consultation/collaboration.   EKG Interpretation None        Wandra Arthurs, MD 10/22/13 2330
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
## 2013-10-22 NOTE — Discharge Instructions (Signed)
Take the prescribed medication as directed for pain.  May continue to have some soreness for the next few days which is normal. Follow-up with your primary care physician if problems occur or no improvement in the next few days. Return to the ED for new or worsening symptoms.
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
## 2013-11-04 ENCOUNTER — Encounter (HOSPITAL_COMMUNITY): Payer: Self-pay | Admitting: Emergency Medicine

## 2014-03-05 IMAGING — CT CT ABD-PELV W/ CM
1 of 2 series · 14 of 32 positions shown, 18 images · IV contrast (OMNIPAQUE 300)
Comparison: Prior CT abdomen/pelvis 07/15/2009

CLINICAL DATA: Sharp lower abdominal pain x2 months

CT ABDOMEN AND PELVIS WITH CONTRAST
TECHNIQUE: Multidetector CT imaging of the abdomen and pelvis was
performed following the standard protocol during bolus
administration of intravenous contrast.
Contrast: 100mL OMNIPAQUE IOHEXOL 300 MG/ML  SOLN, 50mL OMNIPAQUE
IOHEXOL 300 MG/ML  SOLN

[Series 2: abd/pel with · axial · 0.59mm/px · z∈[+937,+1277]mm · 14 of 76 slices shown, 18 images]
[im 4/76  soft-tissue]
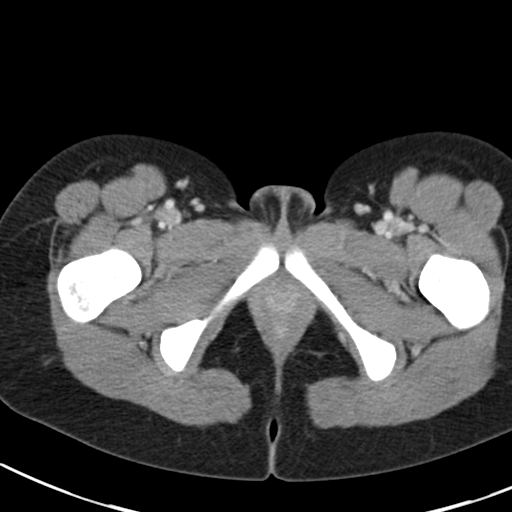
[im 4/76  bone]
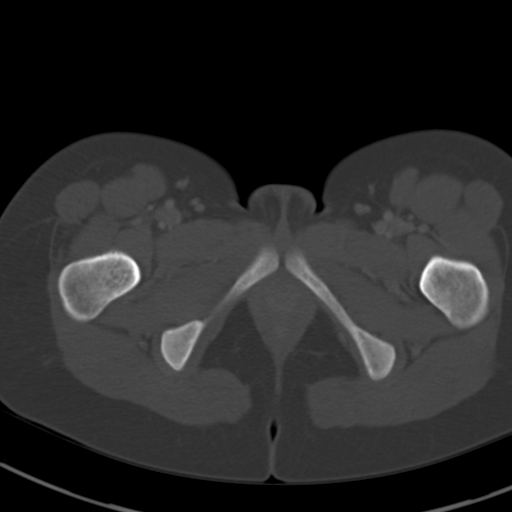
[im 10/76  soft-tissue]
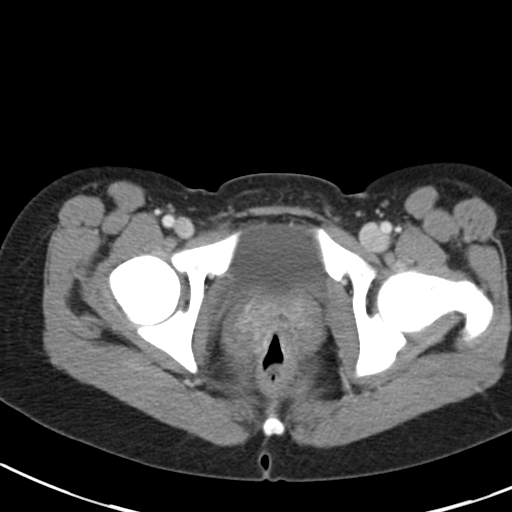
[im 16/76  soft-tissue]
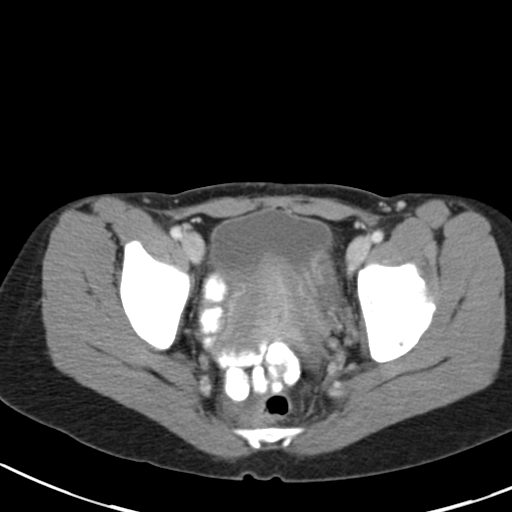
[im 22/76  soft-tissue]
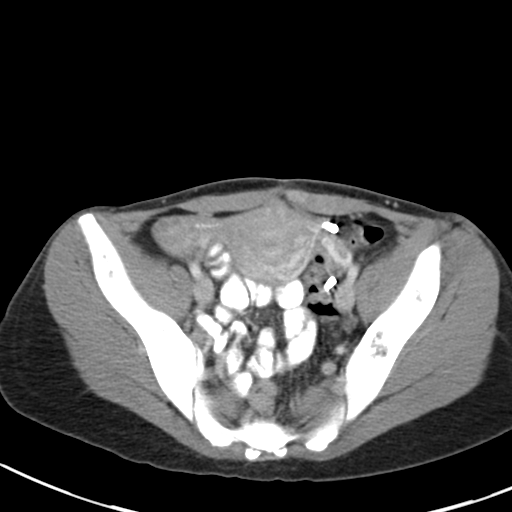
[im 29/76  soft-tissue]
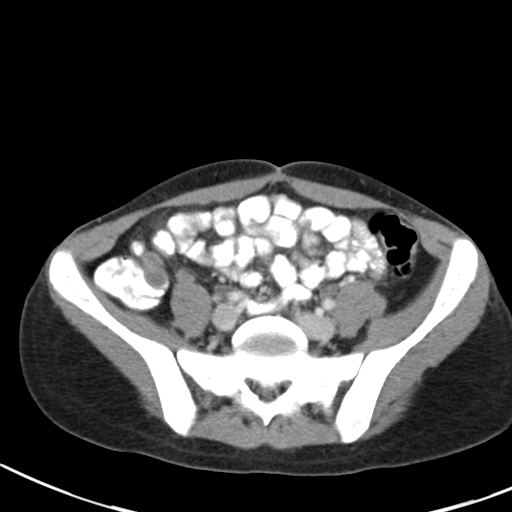
[im 35/76  soft-tissue]
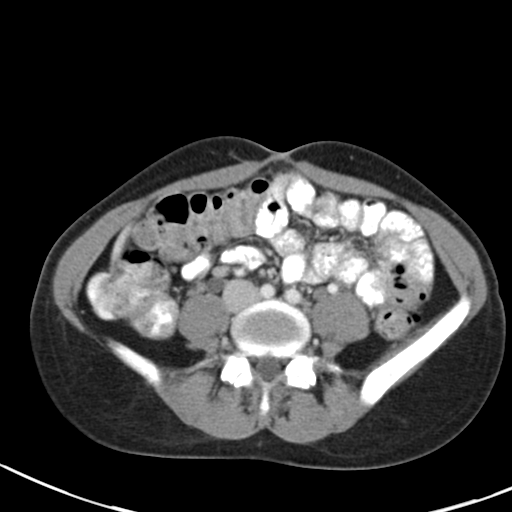
[im 41/76  soft-tissue]
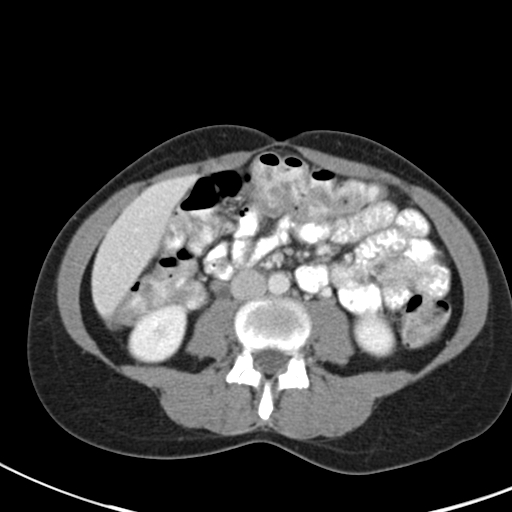
[im 47/76  soft-tissue]
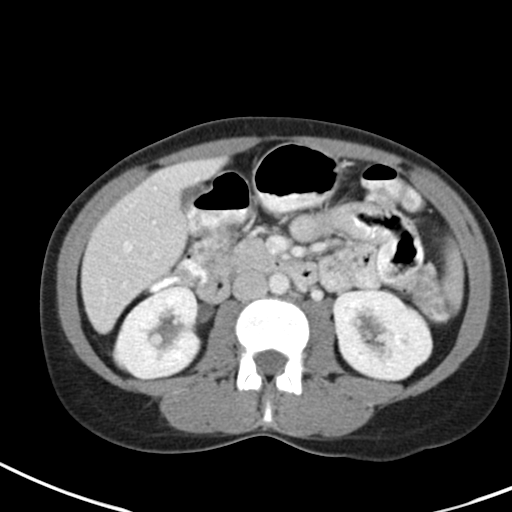
[im 54/76  soft-tissue]
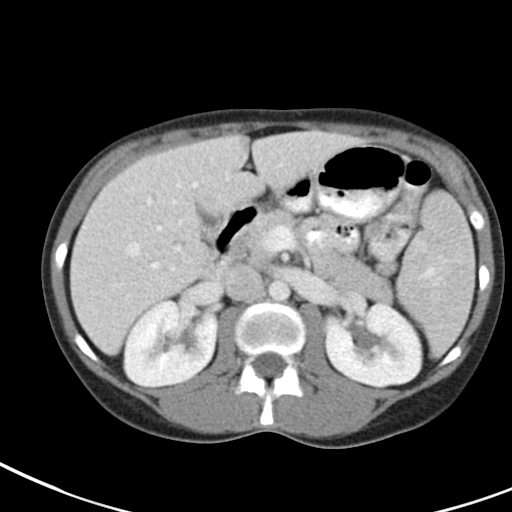
[im 54/76  bone]
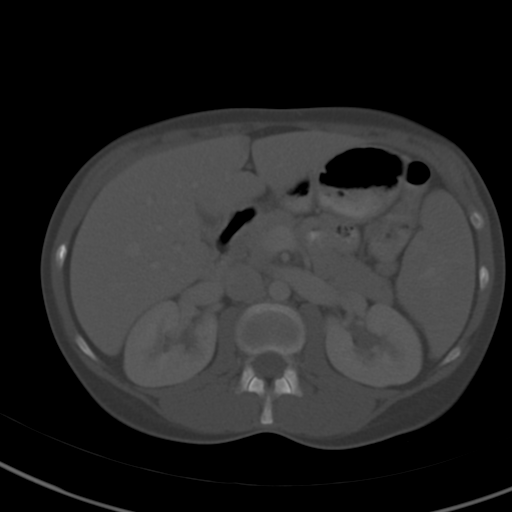
[im 60/76  soft-tissue]
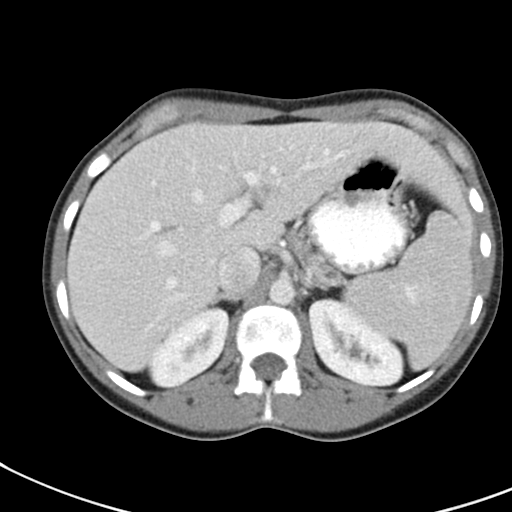
[im 63/76  lung]
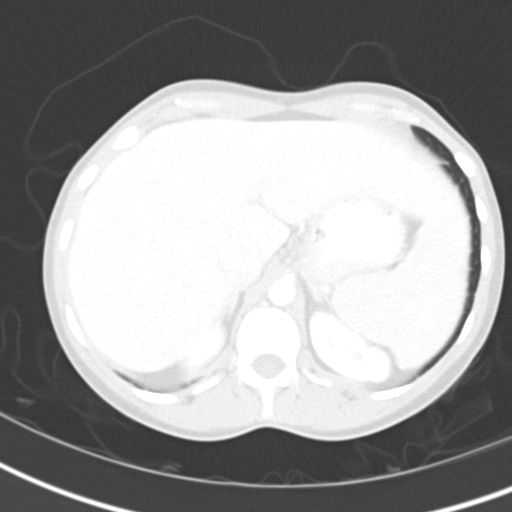
[im 66/76  soft-tissue]
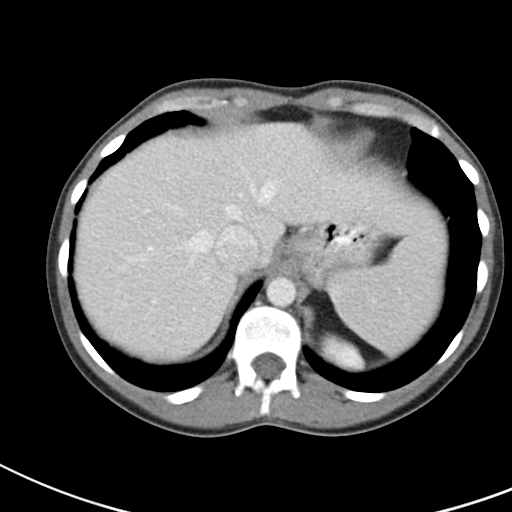
[im 66/76  lung]
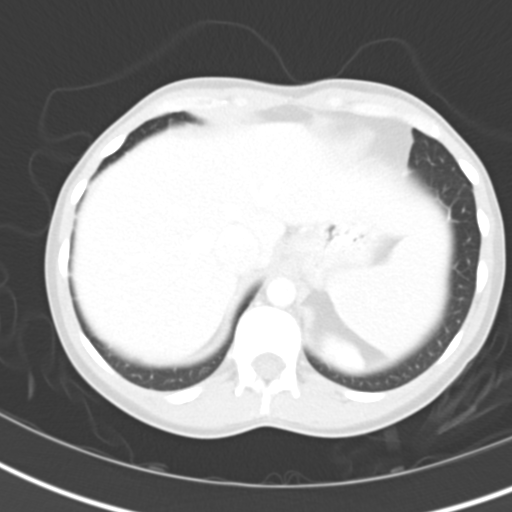
[im 69/76  lung]
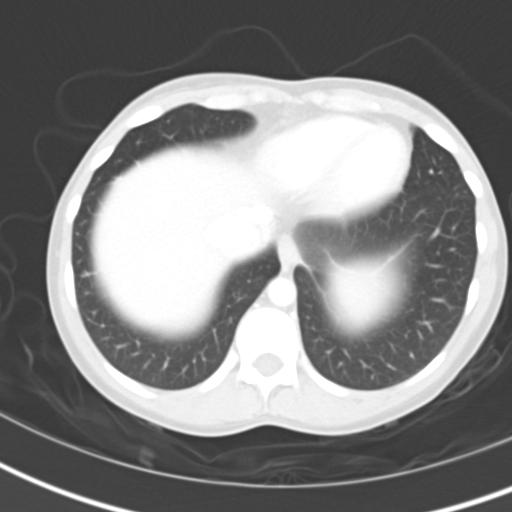
[im 72/76  soft-tissue]
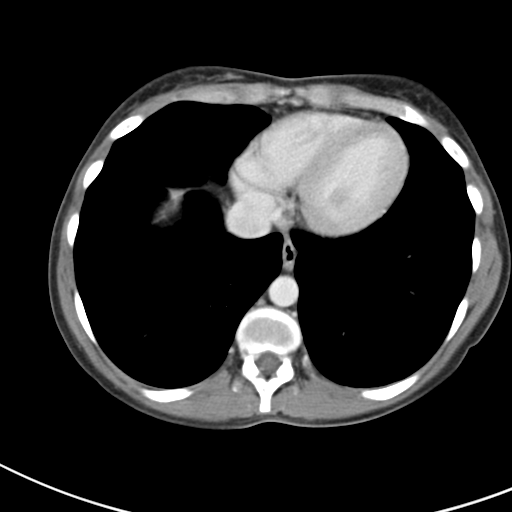
[im 72/76  lung]
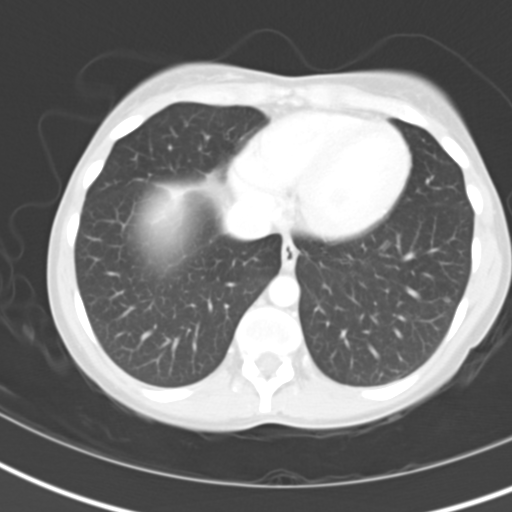

[14 of 32 positions shown; findings below may reference images not displayed]

FINDINGS: Lower Chest:  Stable 4 mm pulmonary nodule in the periphery of the
left lower lobe dating back to 07/15/2009.  Nearly 2-year stability
suggests that this is likely a benign granuloma.  The lung bases
are otherwise clear.  The visualized cardiac structures are within
normal limits for size.  No pericardial effusion.  Normal distal
thoracic esophagus.

Abdomen: Unremarkable CT appearance of the stomach, duodenum,
spleen, adrenal glands and pancreas.  Unremarkable appearance of
the liver.  No focal hepatic lesion.  The hepatic and portal veins
are patent. Gallbladder is unremarkable. No intra or extrahepatic
biliary ductal dilatation.

Unremarkable appearance of the kidneys.  No nephrolithiasis or
hydronephrosis.  No perinephric stranding, abnormal enhancement or
solid lesion.

Normal-caliber large and small bowel throughout the abdomen.  No
evidence of obstruction.  No significant colonic diverticular
disease.  The terminal ileum is unremarkable.  Normal appendix in
the right lower quadrant. No free fluid or adenopathy.

Pelvis:  The right adnexa is unremarkable.  There are two small
enhancing foci anterior to the uterine fundus just subjacent to the
anterior abdominal wall which may represent small fibroids, or
potentially endometrial deposits of the patient has had a prior
hysterectomy.   Peripherally enhancing irregular cyst in the left
ovary suggests a corpus luteum if the patient has recently
lobulated.  Bilateral tubal ligation clips are present. No free
fluid or adenopathy.

Bones/Soft Tissues: 12 x 12 mm enhancing soft tissue nodule within
the medial aspect of the left rectus abdominus muscle just inferior
to the umbilicus.  Correlation with prior imaging reveals a similar
structure in this location without significant interval change.  No
acute fracture or aggressive appearing lytic or blastic osseous
lesion.

Vascular: No atherosclerotic vascular disease or focal vascular
abnormality.
IMPRESSION: 1.  No acute abnormality in the abdomen or pelvis to explain the
patient's clinical symptoms.

2.  Nonspecific 12 mm enhancing soft tissue nodule in the medial
left rectus abdominal muscle just inferior to the umbilicus appears
unchanged compared to prior imaging.  This is of uncertain etiology
and may represent a small venolymphatic malformation.  If the
patient has a history of prior C-section, an endometrial implant in
the rectus abdominous musculature could have a similar appearance.
If clinically warranted, MRI of the pelvis could further evaluate.

3. Two small enhancing foci in the anterior wall of the uterus are
in close approximation with the rectus abdominous enhancing nodule.
These may represent two small uterine fibroids, or again if the
patient is at prior C-section too small endometrial the process.

4. Enhancing left ovarian cyst likely represents a recently
ruptured follicular cyst/corpus luteum of the patient is in the mid
portion of their cycle.

## 2014-03-05 IMAGING — US US TRANSVAGINAL NON-OB
1 series · 13 of 25 positions shown · non-contrast
Comparison: CT of the abdomen and pelvis performed earlier today
at [DATE] p.m., and pelvic ultrasound performed 06/10/2009

CLINICAL DATA: Pelvic pain.

TRANSABDOMINAL AND TRANSVAGINAL ULTRASOUND OF PELVIS
DOPPLER ULTRASOUND OF OVARIES
TECHNIQUE: Both transabdominal and transvaginal ultrasound
examinations of the pelvis were performed. Transabdominal technique
was performed for global imaging of the pelvis including uterus,
ovaries, adnexal regions, and pelvic cul-de-sac.
It was necessary to proceed with endovaginal exam following the
transabdominal exam to visualize the uterus and ovaries in greater
detail.
Color and duplex Doppler ultrasound was utilized to evaluate blood
flow to the ovaries.

[Series 1: us transvaginal non-ob · 0.20mm/px · 13 of 49 slices shown]
[im 1/49]
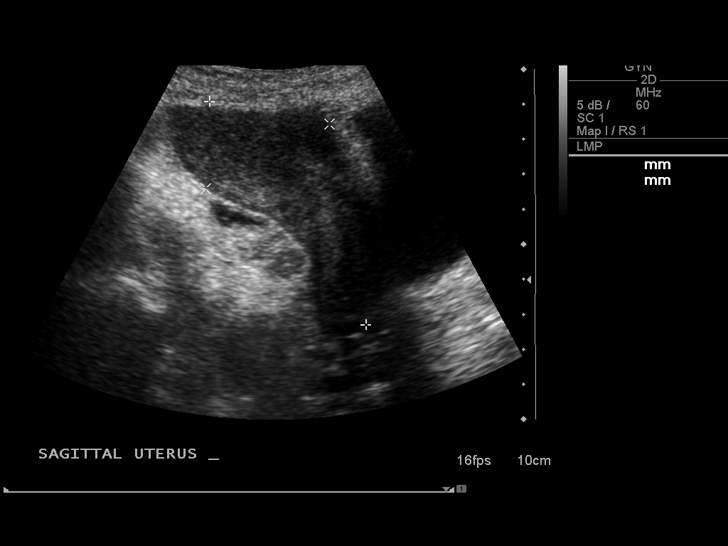
[im 5/49]
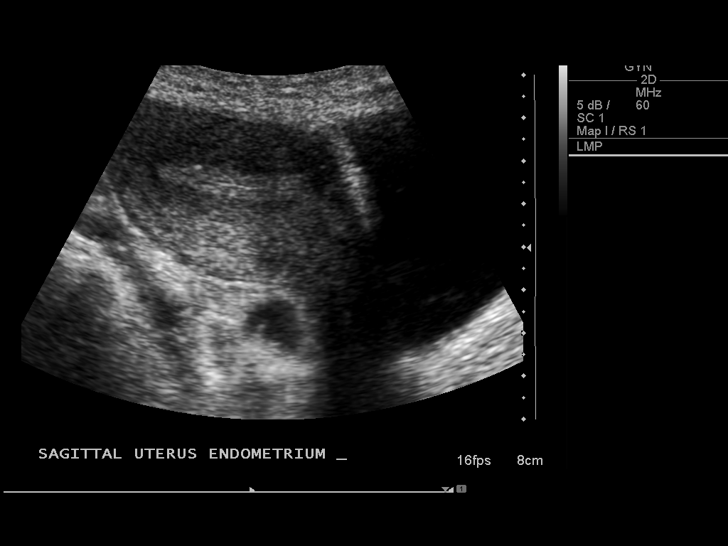
[im 9/49]
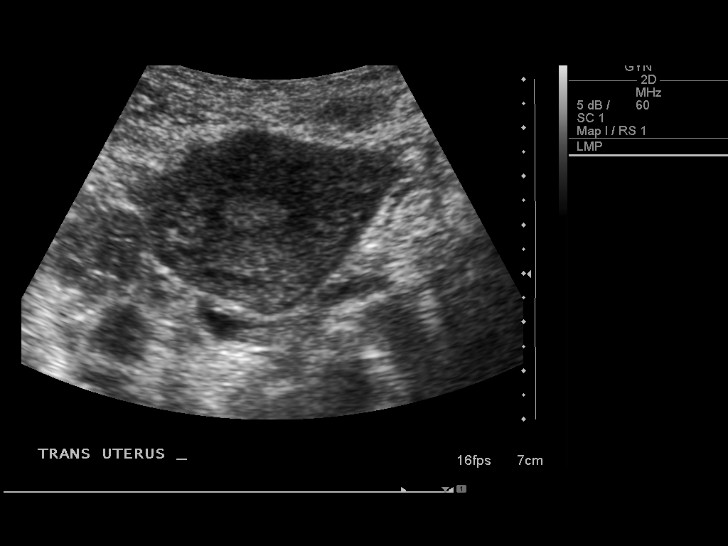
[im 13/49]
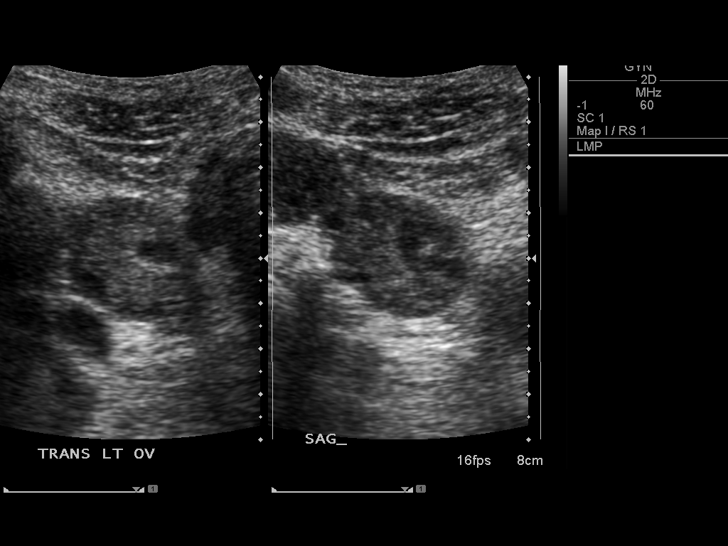
[im 17/49]
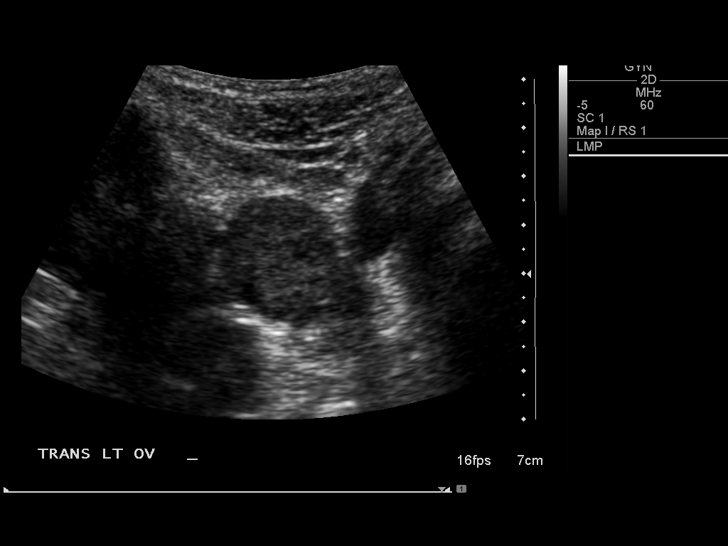
[im 21/49]
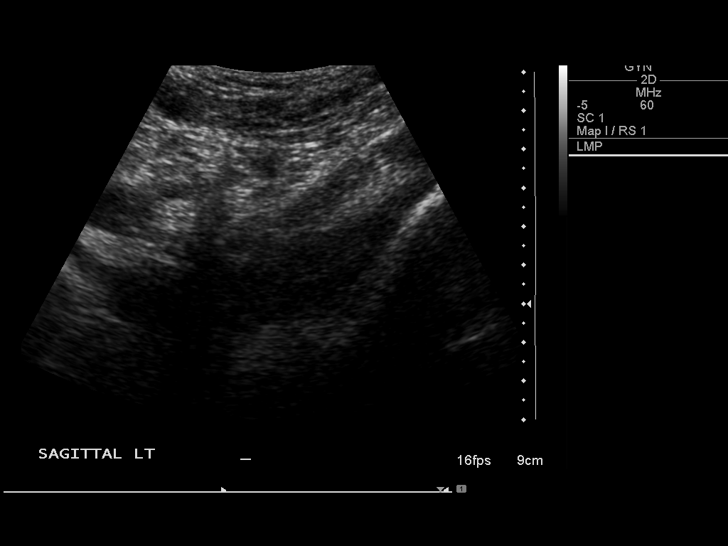
[im 25/49]
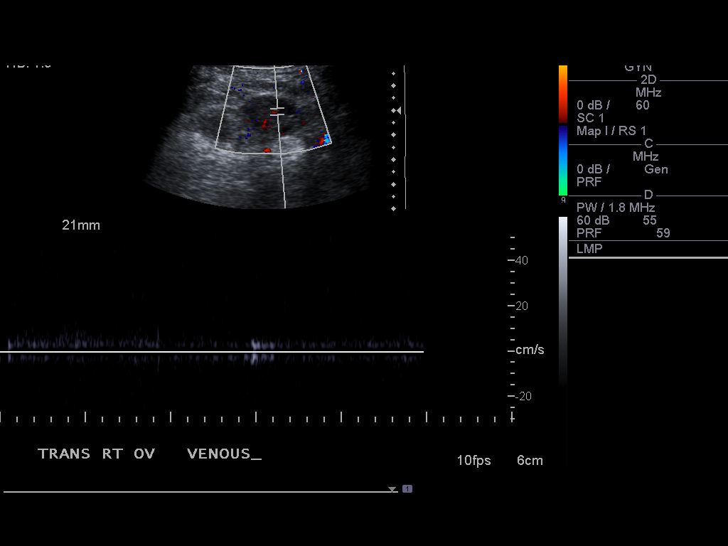
[im 29/49]
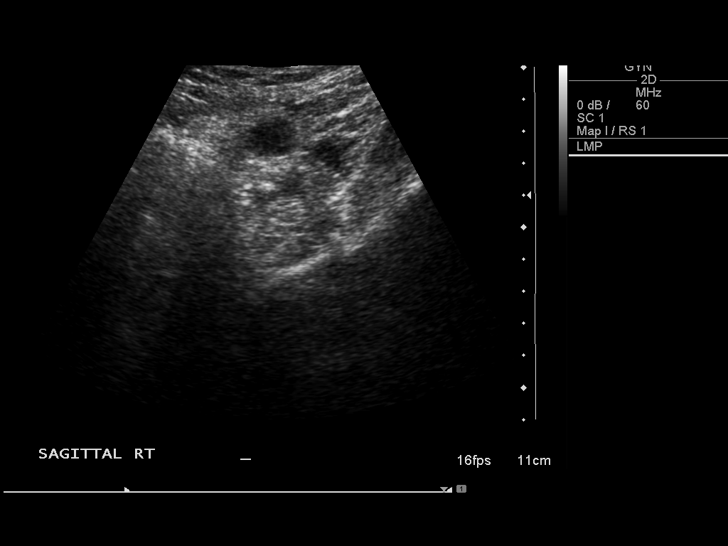
[im 33/49]
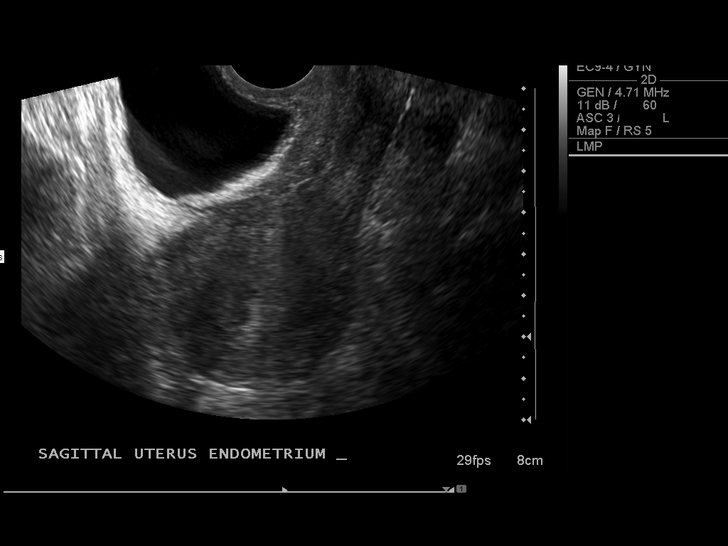
[im 37/49]
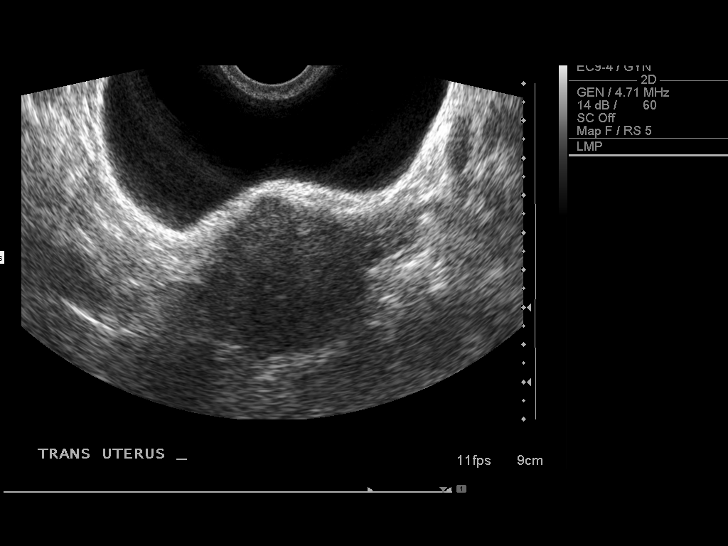
[im 41/49]
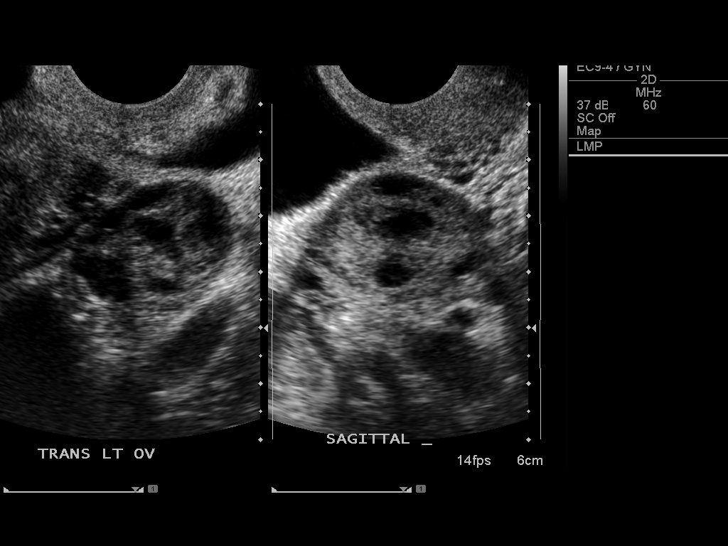
[im 45/49]
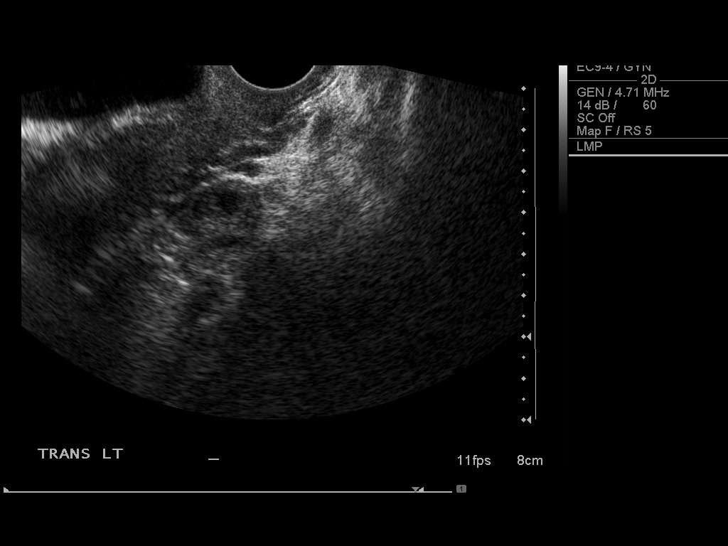
[im 49/49]
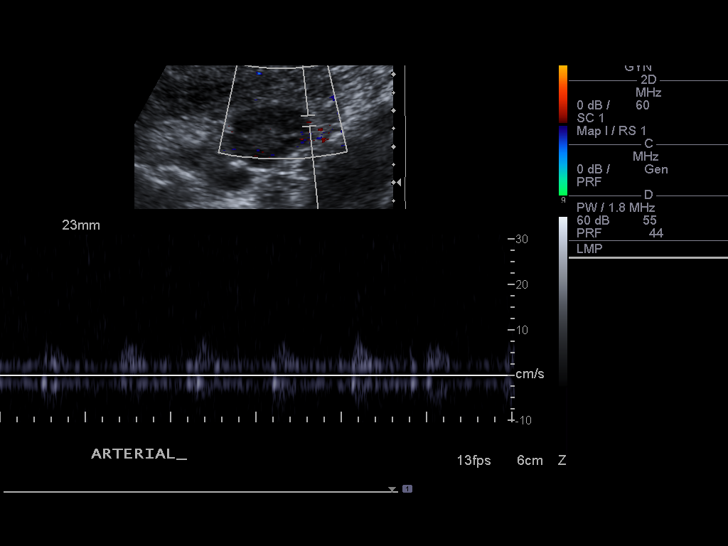

[13 of 25 positions shown; findings below may reference images not displayed]

FINDINGS: Uterus:  Normal in size and appearance; measures 9.5 x 4.6 x
cm.

Endometrium:  Normal in thickness and appearance; measures 1.6 cm
in thickness, reflecting the normal secretory phase of the cycle.

Right ovary: Normal appearance/no adnexal mass; measures 2.5 x
x 2.3 cm.

Left ovary:   Normal appearance/no adnexal mass; measures 3.3 x
x 3.1 cm.

Pulsed Doppler evaluation demonstrates normal low-resistance
arterial and venous waveforms in both ovaries.
IMPRESSION: Normal exam.  No evidence of pelvic mass or other significant
abnormality.

No sonographic evidence for ovarian torsion.

## 2014-09-26 ENCOUNTER — Emergency Department (HOSPITAL_COMMUNITY)
Admission: EM | Admit: 2014-09-26 | Discharge: 2014-09-26 | Disposition: A | Discharge status: Home / Self Care | Payer: Medicaid Other | Attending: Emergency Medicine | Admitting: Emergency Medicine

## 2014-09-26 ENCOUNTER — Emergency Department (HOSPITAL_COMMUNITY): Payer: Medicaid Other

## 2014-09-26 ENCOUNTER — Encounter (HOSPITAL_COMMUNITY): Payer: Self-pay

## 2014-09-26 DIAGNOSIS — S63501A Unspecified sprain of right wrist, initial encounter: Secondary | ICD-10-CM

## 2014-09-26 DIAGNOSIS — Y939 Activity, unspecified: Secondary | ICD-10-CM | POA: Insufficient documentation

## 2014-09-26 DIAGNOSIS — Z8719 Personal history of other diseases of the digestive system: Secondary | ICD-10-CM | POA: Insufficient documentation

## 2014-09-26 DIAGNOSIS — Z720 Tobacco use: Secondary | ICD-10-CM | POA: Insufficient documentation

## 2014-09-26 DIAGNOSIS — Y929 Unspecified place or not applicable: Secondary | ICD-10-CM | POA: Insufficient documentation

## 2014-09-26 DIAGNOSIS — S6991XA Unspecified injury of right wrist, hand and finger(s), initial encounter: Secondary | ICD-10-CM | POA: Diagnosis present

## 2014-09-26 DIAGNOSIS — Z8739 Personal history of other diseases of the musculoskeletal system and connective tissue: Secondary | ICD-10-CM | POA: Diagnosis not present

## 2014-09-26 DIAGNOSIS — Z8701 Personal history of pneumonia (recurrent): Secondary | ICD-10-CM | POA: Diagnosis not present

## 2014-09-26 DIAGNOSIS — Z8679 Personal history of other diseases of the circulatory system: Secondary | ICD-10-CM | POA: Diagnosis not present

## 2014-09-26 DIAGNOSIS — W1830XA Fall on same level, unspecified, initial encounter: Secondary | ICD-10-CM | POA: Diagnosis not present

## 2014-09-26 DIAGNOSIS — Z87442 Personal history of urinary calculi: Secondary | ICD-10-CM | POA: Insufficient documentation

## 2014-09-26 DIAGNOSIS — Y999 Unspecified external cause status: Secondary | ICD-10-CM | POA: Diagnosis not present

## 2014-09-26 MED ORDER — NAPROXEN 500 MG PO TABS
500.0000 mg | ORAL_TABLET | Freq: Once | ORAL | Status: AC
  Administered 2014-09-26: 500 mg via ORAL
  Filled 2014-09-26: qty 1

## 2014-09-26 MED ORDER — NAPROXEN 500 MG PO TABS: 500.0000 mg | ORAL_TABLET | Freq: Two times a day (BID) | ORAL | Status: DC

## 2014-09-26 NOTE — Discharge Instructions (Signed)
Wear a wrist brace for stability. Take naproxen as prescribed. Apply ice to areas of injury/pain 3-4 times per day for 15-20 minutes each time. Follow-up with a hand specialist if symptoms persist or worsen.  Wrist Pain A wrist sprain happens when the bands of tissue that hold the wrist joints together (ligament) stretch too much or tear. A wrist strain happens when muscles or bands of tissue that connect muscles to bones (tendons) are stretched or pulled. HOME CARE  Put ice on the injured area.  Put ice in a plastic bag.  Place a towel between your skin and the bag.  Leave the ice on for 15-20 minutes, 03-04 times a day, for the first 2 days.  Raise (elevate) the injured wrist to lessen puffiness (swelling).  Rest the injured wrist for at least 48 hours or as told by your doctor.  Wear a splint, cast, or an elastic wrap as told by your doctor.  Only take medicine as told by your doctor.  Follow up with your doctor as told. This is important. GET HELP RIGHT AWAY IF:   The fingers are puffy, very red, white, or cold and blue.  The fingers lose feeling (numb) or tingle.  The pain gets worse.  It is hard to move the fingers. MAKE SURE YOU:   Understand these instructions.  Will watch your condition.  Will get help right away if you are not doing well or get worse. Document Released: 06/08/2007 Document Revised: 03/14/2011 Document Reviewed: 02/10/2010 [facility] Patient Information 2015 [place], [place]. This information is not intended to replace advice given to you by your health care provider. Make sure you discuss any questions you have with your health care provider.  RICE: Routine Care for Injuries The routine care of many injuries includes Rest, Ice, Compression, and Elevation (RICE). HOME CARE INSTRUCTIONS  Rest is needed to allow your body to heal. Routine activities can usually be resumed when comfortable. Injured tendons and bones can take up to 6 weeks to heal.  Tendons are the cord-like structures that attach muscle to bone.  Ice following an injury helps keep the swelling down and reduces pain.  Put ice in a plastic bag.  Place a towel between your skin and the bag.  Leave the ice on for 15-20 minutes, 3-4 times a day, or as directed by your health care provider. Do this while awake, for the first 24 to 48 hours. After that, continue as directed by your caregiver.  Compression helps keep swelling down. It also gives support and helps with discomfort. If an elastic bandage has been applied, it should be removed and reapplied every 3 to 4 hours. It should not be applied tightly, but firmly enough to keep swelling down. Watch fingers or toes for swelling, bluish discoloration, coldness, numbness, or excessive pain. If any of these problems occur, remove the bandage and reapply loosely. Contact your caregiver if these problems continue.  Elevation helps reduce swelling and decreases pain. With extremities, such as the arms, hands, legs, and feet, the injured area should be placed near or above the level of the heart, if possible. SEEK IMMEDIATE MEDICAL CARE IF:  You have persistent pain and swelling.  You develop redness, numbness, or unexpected weakness.  Your symptoms are getting worse rather than improving after several days. These symptoms may indicate that further evaluation or further X-rays are needed. Sometimes, X-rays may not show a small broken bone (fracture) until 1 week or 10 days later. Make a follow-up  appointment with your caregiver. Ask when your X-ray results will be ready. Make sure you get your X-ray results. Document Released: 04/03/2000 Document Revised: 12/25/2012 Document Reviewed: 05/21/2010 [facility] Patient Information 2015 [place], [place]. This information is not intended to replace advice given to you by your health care provider. Make sure you discuss any questions you have with your health care provider.

## 2014-09-26 NOTE — ED Notes (Signed)
Pt fell and injured her left wrist one month ago and still complains of pain, she is able to move her wrist

## 2014-09-26 NOTE — ED Provider Notes (Signed)
CSN: [id number]     Arrival date & time 09/26/14  0428 History   First MD Initiated Contact with Patient 09/26/14 [phone]     Chief Complaint  Patient presents with  . Wrist Injury     (Consider location/radiation/quality/duration/timing/severity/associated sxs/prior Treatment) Patient is a 34 y.o. female presenting with wrist injury. The history is provided by the patient. No language interpreter was used.  Wrist Injury Location:  Wrist Time since incident:  1 month Injury: yes   Mechanism of injury: fall   Wrist location:  R wrist Pain details:    Quality:  Aching and throbbing   Radiates to:  Does not radiate   Severity:  Mild   Onset quality:  Gradual   Duration:  1 month   Timing:  Intermittent   Progression:  Waxing and waning Handedness:  Right-handed Prior injury to area:  No Relieved by:  Acetaminophen Worsened by:  Movement Associated symptoms: no decreased range of motion, no muscle weakness, no numbness, no swelling and no tingling   Risk factors: no known bone disorder     Past Medical History  Diagnosis Date  . Pregnancy induced hypertension   . Shortness of breath     Hx: of with exertion  . Pneumonia   . Hypoglycemia     Hx: of   . Stone, kidney     Hx:of  . Hernia     Hx: of  . Headache(784.0)     Hx: of migraines  . Neuromuscular disorder     hx: of tremor  . Constipation     Hx: of   Past Surgical History  Procedure Laterality Date  . Cesarean section    . Kidney stone surgery      Hx: of  . Tubal ligation    . Mass excision N/A 05/31/2012    Procedure: EXCISION ABDOMINAL WALL MASS;  Surgeon: Harl Bowie, MD;  Location: [facility];  Service: General;  Laterality: N/A;  . Hernia repair     History reviewed. No pertinent family history. Social History  Substance Use Topics  . Smoking status: Current Every Day Smoker -- 0.50 packs/day  . Smokeless tobacco: None  . Alcohol Use: Yes     Comment: occasional   OB History    Gravida Para  Term Preterm AB TAB SAB Ectopic Multiple Living   3 3              Review of Systems  Musculoskeletal: Positive for arthralgias.  All other systems reviewed and are negative.   Allergies  Review of patient's allergies indicates no known allergies.  Home Medications   Prior to Admission medications   Medication Sig Start Date End Date Taking? Authorizing Provider  cephALEXin (KEFLEX) 500 MG capsule Take 1 capsule (500 mg total) by mouth 4 (four) times daily. 08/07/13   Domenic Moras, PA-C  HYDROcodone-acetaminophen (HYCET) 7.5-325 mg/15 ml solution Take 15 mLs by mouth every 8 (eight) hours as needed for moderate pain. 02/12/13   Noemi Chapel, MD  methocarbamol (ROBAXIN) 500 MG tablet Take 1 tablet (500 mg total) by mouth 2 (two) times daily. 10/22/13   Larene Pickett, PA-C  naproxen (NAPROSYN) 500 MG tablet Take 1 tablet (500 mg total) by mouth 2 (two) times daily with a meal. 09/26/14   Antonietta Breach, PA-C  oxyCODONE-acetaminophen (PERCOCET/ROXICET) 5-325 MG per tablet Take 1 tablet by mouth every 4 (four) hours as needed. 10/22/13   Larene Pickett, PA-C   BP 120/79  mmHg  Pulse 89  Temp(Src) 98.5 F (36.9 C) (Oral)  Resp 20  Ht 5\' 1"  (1.549 m)  Wt 104 lb (47.174 kg)  BMI 19.66 kg/m2  SpO2 100%  LMP 09/19/2014   Physical Exam  Constitutional: She is oriented to person, place, and time. She appears well-developed and well-nourished. No distress.  HENT:  Head: Normocephalic and atraumatic.  Eyes: Conjunctivae and EOM are normal. No scleral icterus.  Neck: Normal range of motion.  Cardiovascular: Normal rate, regular rhythm and intact distal pulses.   Pulses:      Radial pulses are 2+ on the right side.  Capillary refill brisk in all digits  Pulmonary/Chest: Effort normal. No respiratory distress.  Musculoskeletal: Normal range of motion. She exhibits tenderness.       Right wrist: She exhibits tenderness. She exhibits normal range of motion, no bony tenderness, no swelling, no  effusion, no crepitus and no deformity.       Arms: Neurological: She is alert and oriented to person, place, and time. She exhibits normal muscle tone. Coordination normal.  Sensation to light touch intact. Grip strength 5/5 in the right hand.  Skin: Skin is warm and dry. No rash noted. She is not diaphoretic. No erythema. No pallor.  Psychiatric: She has a normal mood and affect. Her behavior is normal.  Nursing note and vitals reviewed.   ED Course  Procedures (including critical care time) Labs Review Labs Reviewed - No data to display  Imaging Review Dg Wrist Complete Right  09/26/2014   CLINICAL DATA:  Patient fell 1 month ago while mouth Ing the floor. Pain to the ulnar side of the right wrist.  EXAM: RIGHT WRIST - COMPLETE 3+ VIEW  COMPARISON:  None.  FINDINGS: There is no evidence of fracture or dislocation. There is no evidence of arthropathy or other focal bone abnormality. Soft tissues are unremarkable.  IMPRESSION: Negative.   Electronically Signed   By: Lucienne Capers M.D.   On: 09/26/2014 05:32     I have personally reviewed and evaluated these images and lab results as part of my medical decision-making.   EKG Interpretation None      MDM   Final diagnoses:  Wrist sprain, right, initial encounter    34 year old female presents to the emergency department with symptoms consistent with sprain of right wrist. Patient neurovascularly intact. She has been given a wrist brace in the ED. NSAIDs and RICE advised. Return precautions given at discharge. Patient also to be referred to an orthopedic hand specialist if symptoms persist or worsen. Patient discharged in good condition with no unaddressed concerns.   Filed Vitals:   09/26/14 0434  BP: 120/79  Pulse: 89  Temp: 98.5 F (36.9 C)  TempSrc: Oral  Resp: 20  Height: 5\' 1"  (1.549 m)  Weight: 104 lb (47.174 kg)  SpO2: 100%     Antonietta Breach, PA-C 09/26/14 [place], MD 09/26/14 [phone]

## 2015-04-27 ENCOUNTER — Encounter (HOSPITAL_COMMUNITY): Payer: Self-pay | Admitting: Emergency Medicine

## 2015-04-27 ENCOUNTER — Emergency Department (HOSPITAL_COMMUNITY)
Admission: EM | Admit: 2015-04-27 | Discharge: 2015-04-27 | Disposition: A | Discharge status: Home / Self Care | Payer: Medicaid Other | Attending: Emergency Medicine | Admitting: Emergency Medicine

## 2015-04-27 ENCOUNTER — Emergency Department (HOSPITAL_COMMUNITY): Payer: Medicaid Other

## 2015-04-27 DIAGNOSIS — S63610A Unspecified sprain of right index finger, initial encounter: Secondary | ICD-10-CM

## 2015-04-27 DIAGNOSIS — W010XXA Fall on same level from slipping, tripping and stumbling without subsequent striking against object, initial encounter: Secondary | ICD-10-CM | POA: Insufficient documentation

## 2015-04-27 DIAGNOSIS — S67190A Crushing injury of right index finger, initial encounter: Secondary | ICD-10-CM | POA: Diagnosis present

## 2015-04-27 DIAGNOSIS — Y939 Activity, unspecified: Secondary | ICD-10-CM | POA: Diagnosis not present

## 2015-04-27 DIAGNOSIS — Z791 Long term (current) use of non-steroidal anti-inflammatories (NSAID): Secondary | ICD-10-CM | POA: Diagnosis not present

## 2015-04-27 DIAGNOSIS — Z79899 Other long term (current) drug therapy: Secondary | ICD-10-CM | POA: Insufficient documentation

## 2015-04-27 DIAGNOSIS — Y929 Unspecified place or not applicable: Secondary | ICD-10-CM | POA: Diagnosis not present

## 2015-04-27 DIAGNOSIS — F17200 Nicotine dependence, unspecified, uncomplicated: Secondary | ICD-10-CM | POA: Insufficient documentation

## 2015-04-27 DIAGNOSIS — Y999 Unspecified external cause status: Secondary | ICD-10-CM | POA: Diagnosis not present

## 2015-04-27 MED ORDER — IBUPROFEN 800 MG PO TABS
800.0000 mg | ORAL_TABLET | Freq: Once | ORAL | Status: AC
  Administered 2015-04-27: 800 mg via ORAL
  Filled 2015-04-27: qty 1

## 2015-04-27 MED ORDER — IBUPROFEN 800 MG PO TABS: 800.0000 mg | ORAL_TABLET | Freq: Three times a day (TID) | ORAL | Status: DC

## 2015-04-27 NOTE — ED Provider Notes (Signed)
CSN: [id number]     Arrival date & time 04/27/15  0815 History   First MD Initiated Contact with Patient 04/27/15 [phone]     Chief Complaint  Patient presents with  . Hand Injury     (Consider location/radiation/quality/duration/timing/severity/associated sxs/prior Treatment) HPI  Lindsey Sparks is a 35 year old female who presents to the emergency room for evaluation of right index finger injury which she sustained last night when she slipped down a wheelchair ramp and jammed her finger. She has had swelling, bruising and limited range of motion with throbbing pain, rated 2 out of 10. She states that she can use her finger however she is limited due to the swelling. She worked third shift last night was able to write without difficulty. She expected to be better this morning however pain and swelling worsened. She took one dose of Tylenol without much improvement. She denies numbness, tingling, pallor.  Past Medical History  Diagnosis Date  . Pregnancy induced hypertension   . Shortness of breath     Hx: of with exertion  . Pneumonia   . Hypoglycemia     Hx: of   . Stone, kidney     Hx:of  . Hernia     Hx: of  . Headache(784.0)     Hx: of migraines  . Neuromuscular disorder (HCC)     hx: of tremor  . Constipation     Hx: of   Past Surgical History  Procedure Laterality Date  . Cesarean section    . Kidney stone surgery      Hx: of  . Tubal ligation    . Mass excision N/A 05/31/2012    Procedure: EXCISION ABDOMINAL WALL MASS;  Surgeon: Harl Bowie, MD;  Location: [place];  Service: General;  Laterality: N/A;  . Hernia repair     History reviewed. No pertinent family history. Social History  Substance Use Topics  . Smoking status: Current Every Day Smoker -- 0.50 packs/day  . Smokeless tobacco: None  . Alcohol Use: Yes     Comment: occasional   OB History    Gravida Para Term Preterm AB TAB SAB Ectopic Multiple Living   3 3             Review of Systems    All other systems reviewed and are negative.     Allergies  Review of patient's allergies indicates no known allergies.  Home Medications   Prior to Admission medications   Medication Sig Start Date End Date Taking? Authorizing Provider  acetaminophen (TYLENOL) 500 MG tablet Take 1,000 mg by mouth every 6 (six) hours as needed for mild pain, moderate pain, fever or headache.   Yes Historical Provider, MD  ibuprofen (ADVIL,MOTRIN) 800 MG tablet Take 1 tablet (800 mg total) by mouth 3 (three) times daily. 04/27/15   Delsa Grana, PA-C   BP 120/75 mmHg  Pulse 77  Temp(Src) 97.8 F (36.6 C) (Oral)  Resp 18  SpO2 100%  LMP  Physical Exam  Constitutional: She is oriented to person, place, and time. She appears well-developed and well-nourished. No distress.  HENT:  Head: Normocephalic and atraumatic.  Right Ear: External ear normal.  Left Ear: External ear normal.  Nose: Nose normal.  Mouth/Throat: Oropharynx is clear and moist. No oropharyngeal exudate.  Eyes: Conjunctivae and EOM are normal. Pupils are equal, round, and reactive to light. Right eye exhibits no discharge. Left eye exhibits no discharge. No scleral icterus.  Neck: Normal range of motion.  Neck supple. No JVD present. No tracheal deviation present.  Cardiovascular: Normal rate and regular rhythm.   Pulmonary/Chest: Effort normal and breath sounds normal. No stridor. No respiratory distress.  Musculoskeletal: Normal range of motion. She exhibits edema and tenderness.       Right hand: She exhibits tenderness, bony tenderness and swelling. She exhibits normal capillary refill, no deformity and no laceration. Normal sensation noted. Normal strength noted.       Hands: Normal sensation to light touch, normal capillary refill  Lymphadenopathy:    She has no cervical adenopathy.  Neurological: She is alert and oriented to person, place, and time. She exhibits normal muscle tone. Coordination normal.  Skin: Skin is warm and  dry. No rash noted. She is not diaphoretic. No erythema. No pallor.  Psychiatric: She has a normal mood and affect. Her behavior is normal. Judgment and thought content normal.  Nursing note and vitals reviewed.   ED Course  Procedures (including critical care time) Labs Review Labs Reviewed - No data to display  Imaging Review Dg Finger Index Right  04/27/2015  CLINICAL DATA:  [place] on steps 04/26/2015. Right index finger injury. EXAM: RIGHT INDEX FINGER 2+V COMPARISON:  None. FINDINGS: Soft tissue swelling at the PIP joint. No fracture, subluxation or dislocation. Soft tissues are intact. IMPRESSION: No acute bony abnormality. Electronically Signed   By: Rolm Baptise M.D.   On: 04/27/2015 08:37   I have personally reviewed and evaluated these images and lab results as part of my medical decision-making.   EKG Interpretation None      MDM   Patient with right index finger injury with swelling pain and limited range of motion secondary to edema. X-ray negative for fracture dislocation. Patient is neurovascularly intact, normal capillary refill. Patient placed in a finger splint, instructed regarding Rice therapy and prescribed NSAIDs.   Patient otherwise well-appearing, vital signs stable.  Final diagnoses:  Sprain of right index finger, initial encounter        Delsa Grana, PA-C 04/27/15 [place], MD 04/27/15 2252

## 2015-04-27 NOTE — ED Notes (Signed)
Patient slipped and fell on a handicap ramp last night.  She landed on her right hand and now presents with pain, bruising and swelling to her right index PIP.  Patient has full ROM to joint and can flex at MCP and DIP, but not without pain at PIP.

## 2015-04-27 NOTE — Discharge Instructions (Signed)
Finger Sprain A finger sprain is a tear in one of the strong, fibrous tissues that connect the bones (ligaments) in your finger. The severity of the sprain depends on how much of the ligament is torn. The tear can be either partial or complete. CAUSES  Often, sprains are a result of a fall or accident. If you extend your hands to catch an object or to protect yourself, the force of the impact causes the fibers of your ligament to stretch too much. This excess tension causes the fibers of your ligament to tear. SYMPTOMS  You may have some loss of motion in your finger. Other symptoms include:  Bruising.  Tenderness.  Swelling. DIAGNOSIS  In order to diagnose finger sprain, your caregiver will physically examine your finger or thumb to determine how torn the ligament is. Your caregiver may also suggest an X-ray exam of your finger to make sure no bones are broken. TREATMENT  If your ligament is only partially torn, treatment usually involves keeping the finger in a fixed position (immobilization) for a short period. To do this, your caregiver will apply a bandage, cast, or splint to keep your finger from moving until it heals. For a partially torn ligament, the healing process usually takes 2 to 3 weeks. If your ligament is completely torn, you may need surgery to reconnect the ligament to the bone. After surgery a cast or splint will be applied and will need to stay on your finger or thumb for 4 to 6 weeks while your ligament heals. HOME CARE INSTRUCTIONS  Keep your injured finger elevated, when possible, to decrease swelling.  To ease pain and swelling, apply ice to your joint twice a day, for 2 to 3 days:  Put ice in a plastic bag.  Place a towel between your skin and the bag.  Leave the ice on for 15 minutes.  Only take over-the-counter or prescription medicine for pain as directed by your caregiver.  Do not wear rings on your injured finger.  Do not leave your finger unprotected  until pain and stiffness go away (usually 3 to 4 weeks).  Do not allow your cast or splint to get wet. Cover your cast or splint with a plastic bag when you shower or bathe. Do not swim.  Your caregiver may suggest special exercises for you to do during your recovery to prevent or limit permanent stiffness. SEEK IMMEDIATE MEDICAL CARE IF:  Your cast or splint becomes damaged.  Your pain becomes worse rather than better. MAKE SURE YOU:  Understand these instructions.  Will watch your condition.  Will get help right away if you are not doing well or get worse.   This information is not intended to replace advice given to you by your health care provider. Make sure you discuss any questions you have with your health care provider.   Document Released: 01/28/2004 Document Revised: 01/10/2014 Document Reviewed: 08/23/2010 Elsevier Interactive Patient Education 2016 [place] Finger A jammed finger is an injury to the ligaments that support your finger bones. Ligaments are strong bands of tissue that connect bones and keep them in place. This injury happens when the ligaments are stretched beyond their normal range of motion (sprained). CAUSES  A jammed finger is caused by a hard direct hit to the tip of your finger that pushes your finger toward your hand.  RISK FACTORS This injury is more likely to happen if you play sports. SYMPTOMS  Symptoms of a jammed finger include:  Pain.  Swelling.  Discoloration and bruising around the joint.  Difficulty bending or straightening the finger.  Not being able to use the finger normally. DIAGNOSIS  A jammed finger is diagnosed with a medical history and physical exam. You may also have X-rays taken to check for a broken bone (fracture).  TREATMENT  Treatment for a jammed finger may include:  Wearing a splint.  Taping the injured finger to the fingers beside it (buddy taping).  Medicines used to treat pain. Depending on  the type of injury, you may have to do exercises after your finger has begun to heal. This helps you regain strength and mobility in the finger.  HOME CARE INSTRUCTIONS   Take medicines only as directed by your health care provider.  Apply ice to the injured area:   Put ice in a plastic bag.   Place a towel between your skin and the bag.   Leave the ice on for 20 minutes, 2-3 times per day.  Raise the injured area above the level of your heart while you are sitting or lying down.  Wear the splint or tape as directed by your health care provider. Remove it only as directed by your health care provider.  Rest your finger until your health care provider says you can move it again. Your finger may feel stiff and painful for a while.  Perform strengthening exercises as directed by your health care provider. It may help to start doing these exercises with your hand in a bowl of warm water.  Keep all follow-up visits as directed by your health care provider. This is important. SEEK MEDICAL CARE IF:  You have pain or swelling that is getting worse.  Your finger feels cold.  Your finger looks out of place at the joint (deformity).  You still cannot extend your finger after treatment.  You have a fever. SEEK IMMEDIATE MEDICAL CARE IF:   Even after loosening your splint, your finger:  Is very red and swollen.  Is white or blue.  Feels tingly or becomes numb.   This information is not intended to replace advice given to you by your health care provider. Make sure you discuss any questions you have with your health care provider.   Document Released: 06/09/2009 Document Revised: 01/10/2014 Document Reviewed: 10/23/2013 Elsevier Interactive Patient Education [employer].

## 2015-04-27 NOTE — ED Notes (Signed)
Slipped down wheelchair ramp, tried to catch self and hurt right pointer finger. Obvious swelling, bruising to finger

## 2015-07-31 ENCOUNTER — Emergency Department (HOSPITAL_COMMUNITY)
Admission: EM | Admit: 2015-07-31 | Discharge: 2015-07-31 | Disposition: A | Discharge status: Home / Self Care | Payer: Medicaid Other | Attending: Emergency Medicine | Admitting: Emergency Medicine

## 2015-07-31 ENCOUNTER — Encounter (HOSPITAL_COMMUNITY): Payer: Self-pay | Admitting: Emergency Medicine

## 2015-07-31 DIAGNOSIS — Z791 Long term (current) use of non-steroidal anti-inflammatories (NSAID): Secondary | ICD-10-CM | POA: Diagnosis not present

## 2015-07-31 DIAGNOSIS — M79672 Pain in left foot: Secondary | ICD-10-CM | POA: Insufficient documentation

## 2015-07-31 DIAGNOSIS — F17200 Nicotine dependence, unspecified, uncomplicated: Secondary | ICD-10-CM | POA: Insufficient documentation

## 2015-07-31 MED ORDER — NAPROXEN 500 MG PO TABS
500.0000 mg | ORAL_TABLET | Freq: Once | ORAL | Status: AC
  Administered 2015-07-31: 500 mg via ORAL
  Filled 2015-07-31: qty 1

## 2015-07-31 MED ORDER — NAPROXEN 375 MG PO TABS: 375.0000 mg | ORAL_TABLET | Freq: Two times a day (BID) | ORAL | 0 refills | Status: DC

## 2015-07-31 NOTE — ED Notes (Signed)
Dr. Palumbo at bedside. 

## 2015-07-31 NOTE — ED Triage Notes (Signed)
Pt states she has pain to the ball of her right foot  Pt states she initially thought she had a splinter in it   Pt has a dark spot on the bottom of her right foot  Pt states she has tried freezing it and sanding it down but it has not helped

## 2015-07-31 NOTE — ED Provider Notes (Signed)
[facility] DEPT Provider Note   CSN: [id number] Arrival date & time: 07/31/15  0446  First Provider Contact:  None       History   Chief Complaint Chief Complaint  Patient presents with  . Foot Pain    HPI Lindsey Sparks is a 35 y.o. female.  The history is provided by the patient.  Foot Pain  This is a new problem. The current episode started more than 1 week ago. The problem occurs constantly. The problem has not changed since onset.Pertinent negatives include no chest pain and no abdominal pain. Nothing aggravates the symptoms. Nothing relieves the symptoms. Treatments tried: cryospray. The treatment provided no relief.  States has had issues on the 3rd mcp for a while and used cryospray and it has not improved.  Lesion in center of callous with appearance of wart but will not go away.  No trauma  Past Medical History:  Diagnosis Date  . Constipation    Hx: of  . Headache(784.0)    Hx: of migraines  . Hernia    Hx: of  . Hypoglycemia    Hx: of   . Neuromuscular disorder (HCC)    hx: of tremor  . Pneumonia   . Pregnancy induced hypertension   . Shortness of breath    Hx: of with exertion  . Stone, kidney    Hx:of    Patient Active Problem List   Diagnosis Date Noted  . 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 CESAREAN SECTION    . HERNIA REPAIR    . KIDNEY STONE SURGERY     Hx: of  . MASS EXCISION N/A 05/31/2012   Procedure: EXCISION ABDOMINAL WALL MASS;  Surgeon: Harl Bowie, MD;  Location: [facility];  Service: General;  Laterality: N/A;  . TUBAL LIGATION      OB History    Gravida Para Term Preterm AB Living   3 3           SAB TAB Ectopic Multiple Live Births                   Home Medications    Prior to Admission medications   Medication Sig Start Date End Date Taking? Authorizing Provider  acetaminophen (TYLENOL) 500 MG tablet Take 1,000 mg by mouth every 6 (six) hours as needed  for mild pain, moderate pain, fever or headache.    Historical Provider, MD  ibuprofen (ADVIL,MOTRIN) 800 MG tablet Take 1 tablet (800 mg total) by mouth 3 (three) times daily. 04/27/15   Delsa Grana, PA-C  naproxen (NAPROSYN) 375 MG tablet Take 1 tablet (375 mg total) by mouth 2 (two) times daily. 07/31/15   Veatrice Kells, MD    Family History Family History  Problem Relation Age of Onset  . Diabetes Other     Social History Social History  Substance Use Topics  . Smoking status: Current Every Day Smoker    Packs/day: 0.50  . Smokeless tobacco: Never Used  . Alcohol use Yes     Comment: occasional     Allergies   Review of patient's allergies indicates no known allergies.   Review of Systems Review of Systems  Constitutional: Negative for fever.  Cardiovascular: Negative for chest pain.  Gastrointestinal: Negative for abdominal pain.  Musculoskeletal: Negative for joint swelling.  Skin: Negative for color change and wound.  Neurological: Negative for weakness and numbness.  All other systems reviewed and  are negative.    Physical Exam Updated Vital Signs BP 123/88 (BP Location: Left Arm)   Pulse 84   Temp 97.9 F (36.6 C) (Oral)   Resp 20   Ht 5\' 1"  (1.549 m)   Wt 105 lb (47.6 kg)   LMP 07/12/2015 (Approximate)   SpO2 100%   BMI 19.84 kg/m   Physical Exam  Constitutional: She is oriented to person, place, and time. She appears well-developed and well-nourished.  HENT:  Head: Normocephalic.  Eyes: EOM are normal.  Neck: Normal range of motion. Neck supple.  Cardiovascular: Intact distal pulses.   Pulmonary/Chest: No respiratory distress.  Musculoskeletal: Normal range of motion.       Left foot: There is normal range of motion, no tenderness, no bony tenderness, no swelling, normal capillary refill, no crepitus, no deformity and no laceration.       Feet:  Neurological: She is alert and oriented to person, place, and time. She has normal reflexes.  Skin:  Skin is warm and dry.  Psychiatric: She has a normal mood and affect.     ED Treatments / Results  Labs (all labs ordered are listed, but only abnormal results are displayed) Labs Reviewed - No data to display  EKG  EKG Interpretation None       Radiology No results found.  Procedures Procedures (including critical care time)  Medications Ordered in ED Medications  naproxen (NAPROSYN) tablet 500 mg (not administered)     Initial Impression / Assessment and Plan / ED Course  I have reviewed the triage vital signs and the nursing notes.  Pertinent labs & imaging results that were available during my care of the patient were reviewed by me and considered in my medical decision making (see chart for details).  Clinical Course      Final Clinical Impressions(s) / ED Diagnoses   Final diagnoses:  Left foot pain    New Prescriptions New Prescriptions   NAPROXEN (NAPROSYN) 375 MG TABLET    Take 1 tablet (375 mg total) by mouth 2 (two) times daily.   Ongoing issue.  Will refer to podiatry. NSAIDs and close follow up.  All questions answered to patient's satisfaction. Based on history and exam patient has been appropriately medically screened and emergency conditions excluded. Patient is stable for discharge at this time. Follow up with your regular doctor for recheck in 2 daysand strict return precautions given   Teryn Gust, MD 07/31/15 [phone]

## 2015-08-17 ENCOUNTER — Ambulatory Visit: Payer: Self-pay

## 2015-08-17 ENCOUNTER — Encounter: Payer: Self-pay | Admitting: Sports Medicine

## 2015-08-17 ENCOUNTER — Ambulatory Visit (INDEPENDENT_AMBULATORY_CARE_PROVIDER_SITE_OTHER): Payer: Medicaid Other | Admitting: Sports Medicine

## 2015-08-17 VITALS — BP 110/70 | HR 88

## 2015-08-17 DIAGNOSIS — L570 Actinic keratosis: Secondary | ICD-10-CM

## 2015-08-17 DIAGNOSIS — D492 Neoplasm of unspecified behavior of bone, soft tissue, and skin: Secondary | ICD-10-CM

## 2015-08-17 DIAGNOSIS — M79671 Pain in right foot: Secondary | ICD-10-CM

## 2015-08-17 DIAGNOSIS — B070 Plantar wart: Secondary | ICD-10-CM

## 2015-08-17 NOTE — Patient Instructions (Signed)
Plantar Warts Warts are small growths on the skin. They can occur on various areas of the body. When they occur on the underside (sole) of the foot, they are called plantar warts. Plantar warts often occur in groups, with several small warts around a larger growth. They tend to develop over areas of pressure, such as the heel or the ball of the foot. Most warts are not painful, and they usually do not cause problems. However, plantar warts may cause pain when you walk because pressure is applied to them. Warts often go away on their own in time. Various treatments may be done if needed. Sometimes, warts go away and then they come back again. CAUSES Plantar warts are caused by a type of virus that is called human papillomavirus (HPV). HPV attacks a break in the skin of the foot. Walking barefoot can lead to exposure to the virus. These warts may spread to other areas of the sole. They spread to other areas of the body only through direct contact. RISK FACTORS Plantar warts are more likely to develop in:  People who are 10-20 years of age.  People who use public showers or locker rooms.  People who have a weakened body defense system (immune system). SYMPTOMS Plantar warts may be flat or slightly raised. They may grow into the deeper layers of skin or rise above the surface of the skin. Most plantar warts have a rough surface. They may cause pain when you use your foot to support your body weight. DIAGNOSIS A plantar wart can usually be diagnosed from its appearance. In some cases, a tissue sample may be removed (biopsy) to be looked at under a microscope. TREATMENT In many cases, warts do not need treatment. Without treatment, they often go away over a period of many months to a couple years. If treatment is needed, options may include:  Applying medicated solutions, creams, or patches to the wart. These may be over-the-counter or prescription medicines that make the skin soft so that layers will  gradually shed away. In many cases, the medicine is applied one or two times per day and covered with a bandage.  Putting duct tape over the top of the wart (occlusion). You will leave the tape in place for as long as told by your health care provider, then you will replace it with a new strip of tape. This is done until the wart goes away.  Freezing the wart with liquid nitrogen (cryotherapy).  Burning the wart with:  Laser treatment.  An electrified probe (electrocautery).  Injection of a medicine (Candida antigen) into the wart to help the body's immune system to fight off the wart.  Surgery to remove the wart. HOME CARE INSTRUCTIONS  Apply medicated creams or solutions only as told by your health care provider. This may involve:  Soaking the affected area in warm water.  Removing the top layer of softened skin before you apply the medicine. A pumice stone works well for removing the tissue.  Applying a bandage over the affected area after you apply the medicine.  Repeating the process daily or as told by your health care provider.  Do not scratch or pick at a wart.  Wash your hands after you touch a wart.  If a wart is painful, try applying a bandage with a hole in the middle over the wart. The helps to take pressure off the wart.  Keep all follow-up visits as told by your health care provider. This is important. PREVENTION   Take these actions to help prevent warts:  Wear shoes and socks. Change your socks daily.  Keep your feet clean and dry.  Check your feet regularly.  Avoid direct contact with warts on other people. SEEK MEDICAL CARE IF:  Your warts do not improve after treatment.  You have redness, swelling, or pain at the site of a wart.  You have bleeding from a wart that does not stop with light pressure.  You have diabetes and you develop a wart.   This information is not intended to replace advice given to you by your health care provider. Make sure  you discuss any questions you have with your health care provider.   Document Released: 03/12/2003 Document Revised: 09/10/2014 Document Reviewed: 03/17/2014 Elsevier Interactive Patient Education 2016 [employer]  

## 2015-08-17 NOTE — Progress Notes (Signed)
Subjective: Lindsey Sparks is a 35 y.o. female patient who presents to office for evaluation of Right  foot pain secondary to painful wart or callus. Patient has tried OTC freeze away and padding with no relief in symptoms x 8 months. Also went to ER where they told her she had a traumatized wart from over freezing it. Patient denies any other pedal complaints.   Patient Active Problem List   Diagnosis Date Noted  . 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 acetaminophen (TYLENOL) 500 MG tablet Take 1,000 mg by mouth every 6 (six) hours as needed for mild pain, moderate pain, fever or headache.    . ibuprofen (ADVIL,MOTRIN) 800 MG tablet Take 1 tablet (800 mg total) by mouth 3 (three) times daily. 21 tablet 0  . naproxen (NAPROSYN) 375 MG tablet Take 1 tablet (375 mg total) by mouth 2 (two) times daily. 20 tablet 0   No current facility-administered medications on file prior to visit.     No Known Allergies  Objective:  General: Alert and oriented x3 in no acute distress  Dermatology: Keratotic lesion present measuring <0.5cm with nucleated core and with no skin lines transversing the lesion, pain is present with medial lateral pressure to the lesion, 1 capillary with pin point bleeding noted, no webspace macerations, no ecchymosis bilateral, all nails x 10 are well manicured.  Vascular: Dorsalis Pedis and Posterior Tibial pedal pulses 2/4, Capillary Fill Time 3 seconds, + pedal hair growth bilateral, no edema bilateral lower extremities, Temperature gradient within normal limits.  Neurology: Gross sensation intact via light touch bilateral.  Musculoskeletal: Mild tenderness with palpation at the lesion site on Right, Muscular strength 5/5 in all groups without pain or limitation on range of motion. No lower extremity muscular or boney deformity noted.  Assessment and Plan: Problem List Items  Addressed This Visit    None    Visit Diagnoses    Right foot pain    -  Primary   Relevant Orders   DG Foot Complete Right   Plantar wart, right foot       Keratosis          -Complete examination performed -Discussed treatment options for wart with surrounding keratosis -Parred keratoic warty lesion using a chisel blade; treated the area with Catharidin covered with bandaid; Advised patient of blistering reaction that will occur from application of medication and once this happens replace bandaid with neosporin and tape/bandaid -Discussed surgical excision if fails to improve  -Patient to return to office 3 weeks for recheck or sooner if condition worsens.  Landis Martins, DPM

## 2015-08-17 NOTE — Progress Notes (Signed)
   Subjective:    Patient ID: Lindsey Sparks, female    DOB: 09/02/1980, 35 y.o.   MRN: [id number]  HPI    Review of Systems  Musculoskeletal: Positive for gait problem.  All other systems reviewed and are negative.      Objective:   Physical Exam        Assessment & Plan:

## 2015-09-08 ENCOUNTER — Ambulatory Visit: Payer: Medicaid Other | Admitting: Sports Medicine

## 2016-07-07 ENCOUNTER — Emergency Department (HOSPITAL_COMMUNITY)
Admission: EM | Admit: 2016-07-07 | Discharge: 2016-07-07 | Disposition: A | Discharge status: Home / Self Care | Payer: Medicaid Other | Attending: Emergency Medicine | Admitting: Emergency Medicine

## 2016-07-07 ENCOUNTER — Encounter (HOSPITAL_COMMUNITY): Payer: Self-pay | Admitting: Emergency Medicine

## 2016-07-07 DIAGNOSIS — R51 Headache: Secondary | ICD-10-CM | POA: Insufficient documentation

## 2016-07-07 DIAGNOSIS — F17200 Nicotine dependence, unspecified, uncomplicated: Secondary | ICD-10-CM | POA: Insufficient documentation

## 2016-07-07 DIAGNOSIS — R519 Headache, unspecified: Secondary | ICD-10-CM

## 2016-07-07 DIAGNOSIS — R110 Nausea: Secondary | ICD-10-CM | POA: Insufficient documentation

## 2016-07-07 LAB — CBC WITH DIFFERENTIAL/PLATELET
BASOS PCT: 0 %
Basophils Absolute: 0 10*3/uL (ref 0.0–0.1)
Eosinophils Absolute: 0.1 10*3/uL (ref 0.0–0.7)
Eosinophils Relative: 1 %
HEMATOCRIT: 37.9 % (ref 36.0–46.0)
HEMOGLOBIN: 12.7 g/dL (ref 12.0–15.0)
LYMPHS ABS: 2.6 10*3/uL (ref 0.7–4.0)
LYMPHS PCT: 29 %
MCH: 28.9 pg (ref 26.0–34.0)
MCHC: 33.5 g/dL (ref 30.0–36.0)
MCV: 86.3 fL (ref 78.0–100.0)
MONOS PCT: 3 %
Monocytes Absolute: 0.3 10*3/uL (ref 0.1–1.0)
NEUTROS ABS: 5.9 10*3/uL (ref 1.7–7.7)
NEUTROS PCT: 67 %
Platelets: 304 10*3/uL (ref 150–400)
RBC: 4.39 MIL/uL (ref 3.87–5.11)
RDW: 16.4 % — ABNORMAL HIGH (ref 11.5–15.5)
WBC: 8.8 10*3/uL (ref 4.0–10.5)

## 2016-07-07 LAB — URINALYSIS, ROUTINE W REFLEX MICROSCOPIC
BILIRUBIN URINE: NEGATIVE
Glucose, UA: NEGATIVE mg/dL
Hgb urine dipstick: NEGATIVE
Ketones, ur: NEGATIVE mg/dL
Leukocytes, UA: NEGATIVE
NITRITE: NEGATIVE
PH: 6 (ref 5.0–8.0)
Protein, ur: NEGATIVE mg/dL
SPECIFIC GRAVITY, URINE: 1.02 (ref 1.005–1.030)

## 2016-07-07 LAB — I-STAT CHEM 8, ED
BUN: 12 mg/dL (ref 6–20)
CHLORIDE: 104 mmol/L (ref 101–111)
Calcium, Ion: 1.14 mmol/L — ABNORMAL LOW (ref 1.15–1.40)
Creatinine, Ser: 0.4 mg/dL — ABNORMAL LOW (ref 0.44–1.00)
GLUCOSE: 122 mg/dL — AB (ref 65–99)
HCT: 40 % (ref 36.0–46.0)
Hemoglobin: 13.6 g/dL (ref 12.0–15.0)
POTASSIUM: 4 mmol/L (ref 3.5–5.1)
SODIUM: 138 mmol/L (ref 135–145)
TCO2: 23 mmol/L (ref 0–100)

## 2016-07-07 LAB — I-STAT BETA HCG BLOOD, ED (MC, WL, AP ONLY): I-stat hCG, quantitative: <5 m[IU]/mL (ref <5)

## 2016-07-07 MED ORDER — METOCLOPRAMIDE HCL 5 MG/ML IJ SOLN
10.0000 mg | Freq: Once | INTRAMUSCULAR | Status: AC
Start: 2016-07-07 — End: 2016-07-07
  Administered 2016-07-07: 10 mg via INTRAVENOUS
  Filled 2016-07-07: qty 2

## 2016-07-07 MED ORDER — SODIUM CHLORIDE 0.9 % IV BOLUS (SEPSIS)
1000.0000 mL | Freq: Once | INTRAVENOUS | Status: AC
  Administered 2016-07-07: 1000 mL via INTRAVENOUS

## 2016-07-07 MED ORDER — DIPHENHYDRAMINE HCL 50 MG/ML IJ SOLN
25.0000 mg | Freq: Once | INTRAMUSCULAR | Status: AC
  Administered 2016-07-07: 25 mg via INTRAVENOUS
  Filled 2016-07-07: qty 1

## 2016-07-07 MED ORDER — KETOROLAC TROMETHAMINE 30 MG/ML IJ SOLN
30.0000 mg | Freq: Once | INTRAMUSCULAR | Status: AC
  Administered 2016-07-07: 30 mg via INTRAVENOUS
  Filled 2016-07-07: qty 1

## 2016-07-07 NOTE — ED Triage Notes (Signed)
Pt c/o hair loss first noted in 1 week, constant throbbing headache, photophobia x 2 weeks.  No changes in existing blurred vision. Self-treating with 8-12 Advil per day without relief. Hx similar sensation of headaches but in past headaches are intermittent, last only a few hours. Round area of hair loss to crown of head, roots of hair still present to area of lost hair. Recently had hair dyed at salon.

## 2016-07-07 NOTE — ED Notes (Signed)
Writer attempted to collect urine from pt, pt sts she's not able to urinate at this time.

## 2016-07-07 NOTE — Discharge Instructions (Signed)
Please read instructions below. Schedule an appointment with your primary care provider for follow up on your visit today. You can take Advil every 6 hours as needed for pain. Drink plenty of water. Return to the ER for worsening headache, vision changes, or new or concerning symptoms.

## 2016-07-07 NOTE — ED Provider Notes (Signed)
[facility] DEPT Provider Note   CSN: [id number] Arrival date & time: 07/07/16  1352     History   Chief Complaint Chief Complaint  Patient presents with  . Headache    HPI Lindsey Sparks is a 36 y.o. female.  Patient with past medical history of migraines, presents with persistent constant headache 2 weeks. Patient states quality of pain is similar to her previous migraines, however it is not improving with Advil. States headache is throbbing and diffuse. Headache is associated with photophobia intermittent nausea. She also reports hair loss to the crown of her head. She states area is not pruritic, however is concerning her that is related to her headaches. She reports recent visit to hair salon she got her hair dyed. Denies vision changes (states she is not wearing her glasses until her vision is blurry), dysuria, urinary frequency, abdominal pain, chest pain, shortness of breath.      Past Medical History:  Diagnosis Date  . Constipation    Hx: of  . Headache(784.0)    Hx: of migraines  . Hernia    Hx: of  . Hypoglycemia    Hx: of   . Neuromuscular disorder (HCC)    hx: of tremor  . Pneumonia   . Pregnancy induced hypertension   . Shortness of breath    Hx: of with exertion  . Stone, kidney    Hx:of    Patient Active Problem List   Diagnosis Date Noted  . 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 CESAREAN SECTION    . HERNIA REPAIR    . KIDNEY STONE SURGERY     Hx: of  . MASS EXCISION N/A 05/31/2012   Procedure: EXCISION ABDOMINAL WALL MASS;  Surgeon: Harl Bowie, MD;  Location: [facility];  Service: General;  Laterality: N/A;  . TUBAL LIGATION      OB History    Gravida Para Term Preterm AB Living   3 3           SAB TAB Ectopic Multiple Live Births                   Home Medications    Prior to Admission medications   Medication Sig Start Date End Date Taking? Authorizing Provider    ibuprofen (ADVIL,MOTRIN) 200 MG tablet Take 800 mg by mouth every 8 (eight) hours as needed for moderate pain.   Yes [provider]  acetaminophen (TYLENOL) 500 MG tablet Take 1,000 mg by mouth every 6 (six) hours as needed for mild pain, moderate pain, fever or headache.    [provider]  ibuprofen (ADVIL,MOTRIN) 800 MG tablet Take 1 tablet (800 mg total) by mouth 3 (three) times daily. Patient not taking: Reported on 07/07/2016 04/27/15   Delsa Grana, PA-C  naproxen (NAPROSYN) 375 MG tablet Take 1 tablet (375 mg total) by mouth 2 (two) times daily. Patient not taking: Reported on 07/07/2016 07/31/15   Randal Buba, April, MD    Family History Family History  Problem Relation Age of Onset  . Diabetes Other     Social History Social History  Substance Use Topics  . Smoking status: Current Every Day Smoker    Packs/day: 0.50  . Smokeless tobacco: Never Used  . Alcohol use Yes     Comment: occasional     Allergies   Patient has no known allergies.   Review of Systems Review of Systems  Constitutional: Negative  for fever.  HENT: Negative for trouble swallowing.   Eyes: Positive for photophobia. Negative for visual disturbance.  Respiratory: Negative for shortness of breath.   Cardiovascular: Negative for chest pain.  Gastrointestinal: Positive for nausea. Negative for abdominal pain, constipation, diarrhea and vomiting.  Genitourinary: Negative for dysuria and frequency.  Musculoskeletal: Negative for back pain.  Skin:       Hair loss to scalp  Neurological: Positive for headaches. Negative for syncope, facial asymmetry, speech difficulty, weakness and numbness.     Physical Exam Updated Vital Signs BP 125/82 (BP Location: Left Arm)   Pulse 82   Temp 97.6 F (36.4 C) (Oral)   Resp 18   SpO2 99%   Physical Exam  Constitutional: She is oriented to person, place, and time. She appears well-developed and well-nourished.  HENT:  Head: Normocephalic and  atraumatic.  Mouth/Throat: Oropharynx is clear and moist.  Eyes: Conjunctivae and EOM are normal. Pupils are equal, round, and reactive to light.  Neck: Normal range of motion. Neck supple.  Cardiovascular: Normal rate, regular rhythm, normal heart sounds and intact distal pulses.  Exam reveals no friction rub.   No murmur heard. Pulmonary/Chest: Effort normal and breath sounds normal. She has no wheezes. She has no rales.  Abdominal: Soft. Bowel sounds are normal. She exhibits no distension and no mass. There is no tenderness. There is no rebound and no guarding.  Musculoskeletal: Normal range of motion.  Neurological: She is alert and oriented to person, place, and time. She displays normal reflexes. No cranial nerve deficit or sensory deficit. She exhibits normal muscle tone. Coordination normal.  5/5 strength BUE and BLE.  Normal gait.  Skin: Skin is warm.  Psychiatric: She has a normal mood and affect. Her behavior is normal.  Nursing note and vitals reviewed.    ED Treatments / Results  Labs (all labs ordered are listed, but only abnormal results are displayed) Labs Reviewed  CBC WITH DIFFERENTIAL/PLATELET - Abnormal; Notable for the following:       Result Value   RDW 16.4 (*)    All other components within normal limits  URINALYSIS, ROUTINE W REFLEX MICROSCOPIC - Abnormal; Notable for the following:    APPearance HAZY (*)    All other components within normal limits  I-STAT CHEM 8, ED - Abnormal; Notable for the following:    Creatinine, Ser 0.40 (*)    Glucose, Bld 122 (*)    Calcium, Ion 1.14 (*)    All other components within normal limits  I-STAT BETA HCG BLOOD, ED ([facility], [facility], [facility] ONLY)    EKG  EKG Interpretation None       Radiology No results found.  Procedures Procedures (including critical care time)  Medications Ordered in ED Medications  sodium chloride 0.9 % bolus 1,000 mL (1,000 mLs Intravenous New Bag/Given 07/07/16 1804)  ketorolac (TORADOL) 30  MG/ML injection 30 mg (30 mg Intravenous Given 07/07/16 1833)  metoCLOPramide (REGLAN) injection 10 mg (10 mg Intravenous Given 07/07/16 1833)  diphenhydrAMINE (BENADRYL) injection 25 mg (25 mg Intravenous Given 07/07/16 1833)     Initial Impression / Assessment and Plan / ED Course  I have reviewed the triage vital signs and the nursing notes.  Pertinent labs & imaging results that were available during my care of the patient were reviewed by me and considered in my medical decision making (see chart for details).     Pt HA treated and improved while in ED.  Presentation is like pts typical  HA and non concerning for [facility], ICH, Meningitis, or temporal arteritis. Pt is afebrile with no focal neuro deficits, nuchal rigidity, or change in vision. Pt is to follow up with PCP to discuss prophylactic medication. Pt verbalizes understanding and is agreeable with plan to dc.   Discussed results, findings, treatment and follow up. Patient advised of return precautions. Patient verbalized understanding and agreed with plan.  Final Clinical Impressions(s) / ED Diagnoses   Final diagnoses:  Bad headache    New Prescriptions New Prescriptions   No medications on file     Russo, [place] N, PA-C 07/07/16 1946    Sherwood Gambler, MD 07/09/16 [phone]

## 2017-04-30 ENCOUNTER — Encounter (HOSPITAL_COMMUNITY): Payer: Self-pay | Admitting: Emergency Medicine

## 2017-04-30 ENCOUNTER — Emergency Department (HOSPITAL_COMMUNITY)
Admission: EM | Admit: 2017-04-30 | Discharge: 2017-04-30 | Disposition: A | Discharge status: Home / Self Care | Payer: Medicaid Other | Attending: Emergency Medicine | Admitting: Emergency Medicine

## 2017-04-30 DIAGNOSIS — N6310 Unspecified lump in the right breast, unspecified quadrant: Secondary | ICD-10-CM | POA: Insufficient documentation

## 2017-04-30 DIAGNOSIS — F17200 Nicotine dependence, unspecified, uncomplicated: Secondary | ICD-10-CM | POA: Insufficient documentation

## 2017-04-30 DIAGNOSIS — Z5321 Procedure and treatment not carried out due to patient leaving prior to being seen by health care provider: Secondary | ICD-10-CM | POA: Diagnosis not present

## 2017-04-30 NOTE — ED Triage Notes (Signed)
Pt reports when she got out of shower last night noticed right breast knot. Family Hx breast cancer so worried. Is painful with palpation.

## 2017-04-30 NOTE — ED Notes (Signed)
No answer when called for recheck on vital signs 

## 2017-04-30 NOTE — ED Notes (Signed)
Pt called, no responce 

## 2017-04-30 NOTE — ED Notes (Signed)
Pt called for roll call, no responce

## 2017-05-01 ENCOUNTER — Emergency Department (HOSPITAL_COMMUNITY)
Admission: EM | Admit: 2017-05-01 | Discharge: 2017-05-01 | Disposition: A | Discharge status: Home / Self Care | Payer: Medicaid Other | Source: Home / Self Care | Attending: Emergency Medicine | Admitting: Emergency Medicine

## 2017-05-01 ENCOUNTER — Encounter (HOSPITAL_COMMUNITY): Payer: Self-pay | Admitting: Emergency Medicine

## 2017-05-01 ENCOUNTER — Other Ambulatory Visit: Payer: Self-pay

## 2017-05-01 DIAGNOSIS — N63 Unspecified lump in unspecified breast: Secondary | ICD-10-CM

## 2017-05-01 MED ORDER — IBUPROFEN 800 MG PO TABS: 800.0000 mg | ORAL_TABLET | Freq: Three times a day (TID) | ORAL | 0 refills | Status: AC

## 2017-05-01 NOTE — ED Provider Notes (Signed)
[facility] DEPT Provider Note   CSN: [id number] Arrival date & time: 04/30/17  2345     History   Chief Complaint Chief Complaint  Patient presents with  . Breast Mass    HPI Lindsey Sparks is a 37 y.o. female.  The history is provided by the patient.  Illness  This is a new problem. The current episode started 2 days ago. The problem occurs constantly. The problem has not changed since onset.Pertinent negatives include no chest pain, no abdominal pain, no headaches and no shortness of breath. Nothing aggravates the symptoms. Nothing relieves the symptoms. She has tried nothing for the symptoms. The treatment provided no relief.  Patient noticed a tender right breast lesion at approximately the 5 oclock position on the right breast.  No bleeding, no LAN, no fevers, no redness, no nipple discharge or retraction.    Past Medical History:  Diagnosis Date  . Constipation    Hx: of  . Headache(784.0)    Hx: of migraines  . Hernia    Hx: of  . Hypoglycemia    Hx: of   . Neuromuscular disorder (HCC)    hx: of tremor  . Pneumonia   . Pregnancy induced hypertension   . Shortness of breath    Hx: of with exertion  . Stone, kidney    Hx:of    Patient Active Problem List   Diagnosis Date Noted  . 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 CESAREAN SECTION    . HERNIA REPAIR    . KIDNEY STONE SURGERY     Hx: of  . MASS EXCISION N/A 05/31/2012   Procedure: EXCISION ABDOMINAL WALL MASS;  Surgeon: Harl Bowie, MD;  Location: [facility];  Service: General;  Laterality: N/A;  . TUBAL LIGATION       OB History    Gravida  3   Para  3   Term      Preterm      AB      Living        SAB      TAB      Ectopic      Multiple      Live Births               Home Medications    Prior to Admission medications   Medication Sig Start Date End Date Taking? Authorizing Provider    acetaminophen (TYLENOL) 500 MG tablet Take 1,000 mg by mouth every 6 (six) hours as needed for mild pain, moderate pain, fever or headache.    [provider]  ibuprofen (ADVIL,MOTRIN) 200 MG tablet Take 800 mg by mouth every 8 (eight) hours as needed for moderate pain.    [provider]  ibuprofen (ADVIL,MOTRIN) 800 MG tablet Take 1 tablet (800 mg total) by mouth 3 (three) times daily. 05/01/17   Munirah Doerner, MD  naproxen (NAPROSYN) 375 MG tablet Take 1 tablet (375 mg total) by mouth 2 (two) times daily. Patient not taking: Reported on 07/07/2016 07/31/15   Randal Buba, Taunja Brickner, MD    Family History Family History  Problem Relation Age of Onset  . Diabetes Other   . Breast cancer Other     Social History Social History   Tobacco Use  . Smoking status: Current Every Day Smoker    Packs/day: 0.50  . Smokeless tobacco: Never Used  Substance Use Topics  . Alcohol use:  Not Currently  . Drug use: No     Allergies   Patient has no known allergies.   Review of Systems Review of Systems  Respiratory: Negative for shortness of breath.   Cardiovascular: Negative for chest pain, palpitations and leg swelling.  Gastrointestinal: Negative for abdominal pain.  Musculoskeletal: Negative for neck pain and neck stiffness.  Neurological: Negative for headaches.  Hematological: Negative for adenopathy.  All other systems reviewed and are negative.    Physical Exam Updated Vital Signs BP 130/82 (BP Location: Right Arm)   Pulse 99   Temp 97.7 F (36.5 C) (Oral)   Resp 16   Ht 5\' 1"  (1.549 m)   Wt 48.1 kg (106 lb)   LMP 04/18/2017   SpO2 98%   BMI 20.03 kg/m   Physical Exam  Constitutional: She is oriented to person, place, and time. She appears well-developed and well-nourished. No distress.  HENT:  Head: Normocephalic and atraumatic.  Mouth/Throat: No oropharyngeal exudate.  Eyes: Pupils are equal, round, and reactive to light. Conjunctivae are normal.  Neck:  Normal range of motion. Neck supple.  Cardiovascular: Normal rate, regular rhythm, normal heart sounds and intact distal pulses.  Pulmonary/Chest: Effort normal and breath sounds normal. No stridor. She has no wheezes. She has no rales. Right breast exhibits tenderness. Right breast exhibits no inverted nipple, no nipple discharge and no skin change. Left breast exhibits no inverted nipple, no nipple discharge, no skin change and no tenderness. No breast swelling, discharge or bleeding.  Chaperone present for entirety of exam    Abdominal: Soft. Bowel sounds are normal. She exhibits no mass. There is no tenderness. There is no rebound and no guarding.  Musculoskeletal: Normal range of motion.  Lymphadenopathy:       Right axillary: No pectoral and no lateral adenopathy present.       Left axillary: No pectoral and no lateral adenopathy present.      Right: No supraclavicular adenopathy present.       Left: No supraclavicular adenopathy present.  No axillary LAN  Neurological: She is alert and oriented to person, place, and time.  Skin: Skin is warm and dry. Capillary refill takes less than 2 seconds.  Psychiatric: She has a normal mood and affect.     ED Treatments / Results  Labs (all labs ordered are listed, but only abnormal results are displayed) Labs Reviewed - No data to display  EKG None  Radiology No results found.  Procedures Procedures (including critical care time)   Final Clinical Impressions(s) / ED Diagnoses   Final diagnoses:  Breast lump in female   May be a fibrocyst given it is tender but will definitely need a mammogram.  Patient is instructed to follow up with her PMD this week to have this test ordered.  With nurse present patient verbalizes understanding of this and all verbal and written instructions (printed on her discharge paperwork) and agrees to follow up.    Return for weakness, numbness, changes in vision or speech, fevers >100.4 unrelieved by  medication, shortness of breath, intractable vomiting, or diarrhea, abdominal pain, Inability to tolerate liquids or food, cough, altered mental status or any concerns. No signs of systemic illness or infection. The patient is nontoxic-appearing on exam and vital signs are within normal limits.   I have reviewed the triage vital signs and the nursing notes. Pertinent labs &imaging results that were available during my care of the patient were reviewed by me and considered in my  medical decision making (see chart for details).  After history, exam, and medical workup I feel the patient has been appropriately medically screened and is safe for discharge home. Pertinent diagnoses were discussed with the patient. Patient was given return precautions.    ED Discharge Orders        Ordered    ibuprofen (ADVIL,MOTRIN) 800 MG tablet  3 times daily     05/01/17 0251       Shaunte Tuft, MD 05/01/17 6945

## 2017-05-01 NOTE — ED Triage Notes (Signed)
Pt is c/o a mass on her right breast  Pt states she found it last night  Pt states the area is sore and tender to touch

## 2017-08-24 ENCOUNTER — Encounter (HOSPITAL_COMMUNITY): Payer: Self-pay

## 2017-08-24 ENCOUNTER — Other Ambulatory Visit: Payer: Self-pay

## 2017-08-24 ENCOUNTER — Emergency Department (HOSPITAL_COMMUNITY)
Admission: EM | Admit: 2017-08-24 | Discharge: 2017-08-24 | Disposition: A | Discharge status: Home / Self Care | Payer: Medicaid Other | Attending: Emergency Medicine | Admitting: Emergency Medicine

## 2017-08-24 DIAGNOSIS — Z79899 Other long term (current) drug therapy: Secondary | ICD-10-CM | POA: Insufficient documentation

## 2017-08-24 DIAGNOSIS — M25562 Pain in left knee: Secondary | ICD-10-CM | POA: Insufficient documentation

## 2017-08-24 DIAGNOSIS — M25561 Pain in right knee: Secondary | ICD-10-CM | POA: Insufficient documentation

## 2017-08-24 DIAGNOSIS — F17210 Nicotine dependence, cigarettes, uncomplicated: Secondary | ICD-10-CM | POA: Insufficient documentation

## 2017-08-24 DIAGNOSIS — M791 Myalgia, unspecified site: Secondary | ICD-10-CM | POA: Insufficient documentation

## 2017-08-24 LAB — COMPREHENSIVE METABOLIC PANEL
ALT: 197 U/L — ABNORMAL HIGH (ref 0–44)
ANION GAP: 7 (ref 5–15)
AST: 137 U/L — ABNORMAL HIGH (ref 15–41)
Albumin: 3.5 g/dL (ref 3.5–5.0)
Alkaline Phosphatase: 99 U/L (ref 38–126)
BUN: 8 mg/dL (ref 6–20)
CO2: 24 mmol/L (ref 22–32)
Calcium: 8.4 mg/dL — ABNORMAL LOW (ref 8.9–10.3)
Chloride: 107 mmol/L (ref 98–111)
Creatinine, Ser: 0.44 mg/dL (ref 0.44–1.00)
Glucose, Bld: 68 mg/dL — ABNORMAL LOW (ref 70–99)
POTASSIUM: 3.5 mmol/L (ref 3.5–5.1)
Sodium: 138 mmol/L (ref 135–145)
TOTAL PROTEIN: 6.4 g/dL — AB (ref 6.5–8.1)
Total Bilirubin: 0.4 mg/dL (ref 0.3–1.2)

## 2017-08-24 LAB — CBC WITH DIFFERENTIAL/PLATELET
Abs Immature Granulocytes: 0 10*3/uL (ref 0.0–0.1)
BASOS PCT: 2 %
Basophils Absolute: 0.1 10*3/uL (ref 0.0–0.1)
EOS ABS: 0.3 10*3/uL (ref 0.0–0.7)
EOS PCT: 6 %
HEMATOCRIT: 39.5 % (ref 36.0–46.0)
Hemoglobin: 12.2 g/dL (ref 12.0–15.0)
IMMATURE GRANULOCYTES: 0 %
LYMPHS ABS: 1.2 10*3/uL (ref 0.7–4.0)
Lymphocytes Relative: 25 %
MCH: 28.6 pg (ref 26.0–34.0)
MCHC: 30.9 g/dL (ref 30.0–36.0)
MCV: 92.7 fL (ref 78.0–100.0)
Monocytes Absolute: 0.4 10*3/uL (ref 0.1–1.0)
Monocytes Relative: 8 %
NEUTROS PCT: 59 %
Neutro Abs: 2.8 10*3/uL (ref 1.7–7.7)
PLATELETS: 238 10*3/uL (ref 150–400)
RBC: 4.26 MIL/uL (ref 3.87–5.11)
RDW: 15.5 % (ref 11.5–15.5)
WBC: 4.7 10*3/uL (ref 4.0–10.5)

## 2017-08-24 LAB — URINALYSIS, ROUTINE W REFLEX MICROSCOPIC
BACTERIA UA: NONE SEEN
BILIRUBIN URINE: NEGATIVE
Glucose, UA: NEGATIVE mg/dL
Hgb urine dipstick: NEGATIVE
Ketones, ur: NEGATIVE mg/dL
NITRITE: NEGATIVE
PH: 6 (ref 5.0–8.0)
Protein, ur: NEGATIVE mg/dL
SPECIFIC GRAVITY, URINE: 1.018 (ref 1.005–1.030)

## 2017-08-24 LAB — C-REACTIVE PROTEIN: CRP: 1.6 mg/dL — AB (ref <1.0)

## 2017-08-24 LAB — RAPID URINE DRUG SCREEN, HOSP PERFORMED
AMPHETAMINES: POSITIVE — AB
BARBITURATES: NOT DETECTED
BENZODIAZEPINES: NOT DETECTED
COCAINE: NOT DETECTED
Opiates: NOT DETECTED
Tetrahydrocannabinol: POSITIVE — AB

## 2017-08-24 LAB — I-STAT BETA HCG BLOOD, ED (MC, WL, AP ONLY)

## 2017-08-24 LAB — SEDIMENTATION RATE: SED RATE: 10 mm/h (ref 0–22)

## 2017-08-24 MED ORDER — TRAMADOL HCL 50 MG PO TABS: 50.0000 mg | ORAL_TABLET | Freq: Four times a day (QID) | ORAL | 0 refills | Status: AC | PRN

## 2017-08-24 MED ORDER — DOXYCYCLINE HYCLATE 100 MG PO CAPS: 100.0000 mg | ORAL_CAPSULE | Freq: Two times a day (BID) | ORAL | 0 refills | Status: DC

## 2017-08-24 MED ORDER — HYDROCODONE-ACETAMINOPHEN 5-325 MG PO TABS
1.0000 | ORAL_TABLET | Freq: Once | ORAL | Status: AC
  Administered 2017-08-24: 1 via ORAL
  Filled 2017-08-24: qty 1

## 2017-08-24 NOTE — ED Provider Notes (Signed)
Patient placed in Quick Look pathway, seen and evaluated   Chief Complaint: Multi-joint arthralgia  HPI:   Patient states 3 days ago she started to develop pain of both wrists.  Since then, she has developed pain in both wrists, both knees, and both ankles.  She denies fall, trauma, or injury.  She denies history of similar.  No pain in other joints.  Her joints are swollen, itchy, and painful.  She has been taking Aleve every 4 hours without improvement of symptoms.  She denies numbness or tingling.  She denies new rash.  She denies vaginal discharge or concerns for STD.  She has been tested for syphilis, gonorrhea, chlamydia in the past with negative testing.  She smokes cigarettes, drinks minimal alcohol, denies drug use.  No history of IV drug use.  She denies fevers, chills, chest pain, shortness of breath, abdominal pain.  ROS: joint swelling  Physical Exam:   Gen: No distress  Neuro: Awake and Alert  Skin: Warm  MSK: Tenderness of bilateral wrists, knees, and ankles.  Bilateral pedal swelling.  Pedal pulses intact.  Good cap refill.  No tenderness palpation of the calves. ?rash on feet, pt states it is from her itching. ?rash on inner arms, pt states those are freckles and unchanged.    Initiation of care has begun. The patient has been counseled on the process, plan, and necessity for staying for the completion/evaluation, and the remainder of the medical screening examination    Franchot Heidelberg, PA-C 08/24/17 1329    Long, Wonda Olds, MD 08/24/17 2019

## 2017-08-24 NOTE — ED Triage Notes (Signed)
Pt reports she initially had swelling in her hands bilaterally. The swelling moved to her knees, now her ankles. Ankle swelling noted. No associated CP or SOB.

## 2017-08-24 NOTE — ED Provider Notes (Signed)
[facility] EMERGENCY DEPARTMENT Provider Note   CSN: [id number] Arrival date & time: 08/24/17  1226     History   Chief Complaint Chief Complaint  Patient presents with  . Joint Swelling    HPI Lindsey Sparks is a 37 y.o. female.  37 year old female with medical history as detailed below presents with complaint of multiple joints that are painful.  Symptoms started 3 days ago.  Patient is a very busy individual with multiple jobs.  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  She believes that one reason her joints are painful secondary to overuse.  Associated fever, specific trauma, nausea, vomiting, abdominal pain, rash, or other acute complaint.  She does report a prior history significant for multiple recent tick bites.  She denies any prior history of tickborne illness.  She also denies any history of STD.  She is not concerned for same today.  The history is provided by the patient and medical records.  Illness  This is a new problem. The current episode started more than 2 days ago. The problem occurs constantly. The problem has been resolved. Pertinent negatives include no chest pain, no abdominal pain, no headaches and no shortness of breath. Nothing aggravates the symptoms. Nothing relieves the symptoms.    Past Medical History:  Diagnosis Date  . Constipation    Hx: of  . Headache(784.0)    Hx: of migraines  . Hernia    Hx: of  . Hypoglycemia    Hx: of   . Neuromuscular disorder (HCC)    hx: of tremor  . Pneumonia   . Pregnancy induced hypertension   . Shortness of breath    Hx: of with exertion  . Stone, kidney    Hx:of    Patient Active Problem List   Diagnosis Date Noted  . 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 CESAREAN SECTION    . HERNIA REPAIR    . KIDNEY STONE SURGERY     Hx: of  . MASS EXCISION N/A 05/31/2012   Procedure: EXCISION ABDOMINAL WALL  MASS;  Surgeon: Harl Bowie, MD;  Location: [place];  Service: General;  Laterality: N/A;  . TUBAL LIGATION       OB History    Gravida  3   Para  3   Term      Preterm      AB      Living        SAB      TAB      Ectopic      Multiple      Live Births               Home Medications    Prior to Admission medications   Medication Sig Start Date End Date Taking? Authorizing Provider  acetaminophen (TYLENOL) 500 MG tablet Take 1,000 mg by mouth every 6 (six) hours as needed for mild pain, moderate pain, fever or headache.    [provider]  doxycycline (VIBRAMYCIN) 100 MG capsule Take 1 capsule (100 mg total) by mouth 2 (two) times daily. 08/24/17   Valarie Merino, MD  ibuprofen (ADVIL,MOTRIN) 200 MG tablet Take 800 mg by mouth every 8 (eight) hours as needed for moderate pain.    [provider]  ibuprofen (ADVIL,MOTRIN) 800 MG tablet Take 1 tablet (800 mg total) by mouth 3 (three) times daily. 05/01/17  Palumbo, April, MD  naproxen (NAPROSYN) 375 MG tablet Take 1 tablet (375 mg total) by mouth 2 (two) times daily. Patient not taking: Reported on 07/07/2016 07/31/15   Randal Buba, April, MD    Family History Family History  Problem Relation Age of Onset  . Diabetes Other   . Breast cancer Other     Social History Social History   Tobacco Use  . Smoking status: Current Every Day Smoker    Packs/day: 1.00  . Smokeless tobacco: Never Used  Substance Use Topics  . Alcohol use: Not Currently  . Drug use: No     Allergies   Patient has no known allergies.   Review of Systems Review of Systems  Respiratory: Negative for shortness of breath.   Cardiovascular: Negative for chest pain.  Gastrointestinal: Negative for abdominal pain.  Neurological: Negative for headaches.  All other systems reviewed and are negative.    Physical Exam Updated Vital Signs BP 121/74 (BP Location: Right Arm)   Pulse 85   Temp (!) 97.5 F (36.4 C)  (Oral)   Resp 18   LMP 08/03/2017 (Within Days)   SpO2 98%   Physical Exam  Constitutional: She is oriented to person, place, and time. She appears well-developed and well-nourished. No distress.  HENT:  Head: Normocephalic and atraumatic.  Mouth/Throat: Oropharynx is clear and moist.  Eyes: Pupils are equal, round, and reactive to light. Conjunctivae and EOM are normal.  Neck: Normal range of motion. Neck supple.  Cardiovascular: Normal rate, regular rhythm and normal heart sounds.  Pulmonary/Chest: Effort normal and breath sounds normal. No respiratory distress.  Abdominal: Soft. Bowel sounds are normal. She exhibits no distension. There is no tenderness.  Musculoskeletal: Normal range of motion. She exhibits no edema or deformity.  Neurological: She is alert and oriented to person, place, and time.  Skin: Skin is warm and dry.  No rash  No palmar rash or rash on sole of foot   Psychiatric: She has a normal mood and affect.  Nursing note and vitals reviewed.    ED Treatments / Results  Labs (all labs ordered are listed, but only abnormal results are displayed) Labs Reviewed  COMPREHENSIVE METABOLIC PANEL - Abnormal; Notable for the following components:      Result Value   Glucose, Bld 68 (*)    Calcium 8.4 (*)    Total Protein 6.4 (*)    AST 137 (*)    ALT 197 (*)    All other components within normal limits  C-REACTIVE PROTEIN - Abnormal; Notable for the following components:   CRP 1.6 (*)    All other components within normal limits  CBC WITH DIFFERENTIAL/PLATELET  SEDIMENTATION RATE  HIV ANTIBODY (ROUTINE TESTING)  RPR  URINALYSIS, ROUTINE W REFLEX MICROSCOPIC  RAPID URINE DRUG SCREEN, HOSP PERFORMED  I-STAT BETA HCG BLOOD, ED ([facility], [facility], [facility] ONLY)    EKG None  Radiology No results found.  Procedures Procedures (including critical care time)  Medications Ordered in ED Medications  HYDROcodone-acetaminophen (NORCO/VICODIN) 5-325 MG per tablet 1 tablet (1  tablet Oral Given 08/24/17 1504)     Initial Impression / Assessment and Plan / ED Course  I have reviewed the triage vital signs and the nursing notes.  Pertinent labs & imaging results that were available during my care of the patient were reviewed by me and considered in my medical decision making (see chart for details).     MDM  Screen complete  Patient presents for evaluation of  poly-articular myalgia.  Screening labs are without significant abnormality.  Patient does report prior history of recent tick bites.  There is a slight possibility that the patient's myalgias are secondary to a new two-point infection.  Of note, she is afebrile and her white count is normal. Out of an abundance of caution, I will prescribe a course of doxycycline.  Patient feels improved following her ED evaluation and desires DC home.   Close follow up is advised. Strict return precautions given and understood.   Final Clinical Impressions(s) / ED Diagnoses   Final diagnoses:  Pain in both knees, unspecified chronicity  Myalgia    ED Discharge Orders         Ordered    doxycycline (VIBRAMYCIN) 100 MG capsule  2 times daily     08/24/17 1626           Valarie Merino, MD 08/24/17 [phone]

## 2017-08-24 NOTE — Discharge Instructions (Addendum)
Return for any problem.  Follow-up with your regular doctor as instructed. °

## 2017-08-24 NOTE — ED Notes (Signed)
During pt DC, pt adamnet about needing stronger pain medication prescription. EDP at bedside. Pt stable and ambulatory at DC

## 2017-08-25 LAB — HIV ANTIBODY (ROUTINE TESTING W REFLEX): HIV Screen 4th Generation wRfx: NONREACTIVE

## 2017-08-25 LAB — RPR: RPR Ser Ql: NONREACTIVE

## 2019-11-05 ENCOUNTER — Encounter (HOSPITAL_COMMUNITY): Payer: Self-pay

## 2019-11-05 ENCOUNTER — Emergency Department (HOSPITAL_COMMUNITY): Payer: Self-pay

## 2019-11-05 ENCOUNTER — Emergency Department (HOSPITAL_COMMUNITY)
Admission: EM | Admit: 2019-11-05 | Discharge: 2019-11-05 | Disposition: A | Discharge status: Home / Self Care | Payer: Self-pay | Attending: Emergency Medicine | Admitting: Emergency Medicine

## 2019-11-05 ENCOUNTER — Other Ambulatory Visit: Payer: Self-pay

## 2019-11-05 DIAGNOSIS — M546 Pain in thoracic spine: Secondary | ICD-10-CM | POA: Insufficient documentation

## 2019-11-05 DIAGNOSIS — F17200 Nicotine dependence, unspecified, uncomplicated: Secondary | ICD-10-CM | POA: Insufficient documentation

## 2019-11-05 DIAGNOSIS — M62838 Other muscle spasm: Secondary | ICD-10-CM | POA: Insufficient documentation

## 2019-11-05 MED ORDER — IBUPROFEN 200 MG PO TABS
600.0000 mg | ORAL_TABLET | Freq: Once | ORAL | Status: AC
  Administered 2019-11-05: 600 mg via ORAL
  Filled 2019-11-05: qty 3

## 2019-11-05 MED ORDER — METHOCARBAMOL 500 MG PO TABS: 500.0000 mg | ORAL_TABLET | Freq: Three times a day (TID) | ORAL | 0 refills | Status: AC | PRN

## 2019-11-05 MED ORDER — OXYCODONE HCL 5 MG PO TABS
5.0000 mg | ORAL_TABLET | Freq: Once | ORAL | Status: AC
  Administered 2019-11-05: 5 mg via ORAL
  Filled 2019-11-05: qty 1

## 2019-11-05 MED ORDER — METHOCARBAMOL 500 MG PO TABS
1000.0000 mg | ORAL_TABLET | Freq: Once | ORAL | Status: AC
  Administered 2019-11-05: 1000 mg via ORAL
  Filled 2019-11-05: qty 2

## 2019-11-05 MED ORDER — NAPROXEN 500 MG PO TABS: 500.0000 mg | ORAL_TABLET | Freq: Two times a day (BID) | ORAL | 0 refills | Status: AC

## 2019-11-05 MED ORDER — ACETAMINOPHEN 500 MG PO TABS
1000.0000 mg | ORAL_TABLET | Freq: Once | ORAL | Status: AC
  Administered 2019-11-05: 1000 mg via ORAL
  Filled 2019-11-05: qty 2

## 2019-11-05 NOTE — ED Provider Notes (Signed)
[facility] Emergency Department Provider Note MRN:  [id number]  Arrival date & time: 11/05/19     Chief Complaint   Back Pain   History of Present Illness   Lindsey Sparks is a 39 y.o. year-old female with no pertinent past medical history presenting to the ED with chief complaint of back pain.  Patient works a demanding physical labor job Loss adjuster, [employer] and patio's.  Had been feeling well, was woken up by her cat who needed to be let out and when she opened the door she experienced sudden onset severe pain to the left thoracic back, worse with motion or palpation, worse with movement of the left arm.  No chest pain, no headache, no abdominal pain, no numbness or weakness to the arms or legs.  Pain worse with deep breathing.  Review of Systems  A complete 10 system review of systems was obtained and all systems are negative except as noted in the HPI and PMH.   Patient's Health History    Past Medical History:  Diagnosis Date  . Constipation    Hx: of  . Headache(784.0)    Hx: of migraines  . Hernia    Hx: of  . Hypoglycemia    Hx: of   . Neuromuscular disorder (HCC)    hx: of tremor  . Pneumonia   . Pregnancy induced hypertension   . Shortness of breath    Hx: of with exertion  . Stone, kidney    Hx:of    Past Surgical History:  Procedure Laterality Date  . CESAREAN SECTION    . HERNIA REPAIR    . KIDNEY STONE SURGERY     Hx: of  . MASS EXCISION N/A 05/31/2012   Procedure: EXCISION ABDOMINAL WALL MASS;  Surgeon: Harl Bowie, MD;  Location: [place];  Service: General;  Laterality: N/A;  . TUBAL LIGATION      Family History  Problem Relation Age of Onset  . Diabetes Other   . Breast cancer Other     Social History   Socioeconomic History  . Marital status: Single    Spouse name: Not on file  . Number of children: Not on file  . Years of education: Not on file  . Highest education level: Not on file  Occupational History   . Not on file  Tobacco Use  . Smoking status: Current Every Day Smoker    Packs/day: 1.00  . Smokeless tobacco: Never Used  Vaping Use  . Vaping Use: Never used  Substance and Sexual Activity  . Alcohol use: Not Currently  . Drug use: No  . Sexual activity: Yes    Birth control/protection: Other-see comments    Comment: tubes tide per patient  Other Topics Concern  . Not on file  Social History Narrative  . Not on file   Social Determinants of Health   Financial Resource Strain:   . Difficulty of Paying Living Expenses: Not on file  Food Insecurity:   . Worried About Charity fundraiser in the Last Year: Not on file  . Ran Out of Food in the Last Year: Not on file  Transportation Needs:   . Lack of Transportation (Medical): Not on file  . Lack of Transportation (Non-Medical): Not on file  Physical Activity:   . Days of Exercise per Week: Not on file  . Minutes of Exercise per Session: Not on file  Stress:   . Feeling of Stress : Not on  file  Social Connections:   . Frequency of Communication with Friends and Family: Not on file  . Frequency of Social Gatherings with Friends and Family: Not on file  . Attends Religious Services: Not on file  . Active Member of Clubs or Organizations: Not on file  . Attends Archivist Meetings: Not on file  . Marital Status: Not on file  Intimate Partner Violence:   . Fear of Current or Ex-Partner: Not on file  . Emotionally Abused: Not on file  . Physically Abused: Not on file  . Sexually Abused: Not on file     Physical Exam   Vitals:   11/05/19 1018 11/05/19 1133  BP: (!) 143/99 109/82  Pulse: (!) 102 95  Resp: 18 18  Temp: 98.4 F (36.9 C)   SpO2: 100% 100%    CONSTITUTIONAL: Well-appearing, NAD NEURO:  Alert and oriented x 3, no focal deficits EYES:  eyes equal and reactive ENT/NECK:  no LAD, no JVD CARDIO: Regular rate, well-perfused, normal S1 and S2 PULM:  CTAB no wheezing or rhonchi GI/GU:  normal  bowel sounds, non-distended, non-tender MSK/SPINE:  No gross deformities, no edema; tenderness palpation to the left thoracic musculature, no midline tenderness SKIN:  no rash, atraumatic PSYCH:  Appropriate speech and behavior  *Additional and/or pertinent findings included in MDM below  Diagnostic and Interventional Summary    EKG Interpretation  Date/Time:    Ventricular Rate:    PR Interval:    QRS Duration:   QT Interval:    QTC Calculation:   R Axis:     Text Interpretation:        Labs Reviewed - No data to display  DG Chest Port 1 View    (Results Pending)    Medications  acetaminophen (TYLENOL) tablet 1,000 mg (1,000 mg Oral Given 11/05/19 1129)  methocarbamol (ROBAXIN) tablet 1,000 mg (1,000 mg Oral Given 11/05/19 1130)  oxyCODONE (Oxy IR/ROXICODONE) immediate release tablet 5 mg (5 mg Oral Given 11/05/19 1131)  ibuprofen (ADVIL) tablet 600 mg (600 mg Oral Given 11/05/19 1129)     Procedures  /  Critical Care Procedures  ED Course and Medical Decision Making  I have reviewed the triage vital signs, the nursing notes, and pertinent available records from the EMR.  Listed above are laboratory and imaging tests that I personally ordered, reviewed, and interpreted and then considered in my medical decision making (see below for details).  X-ray to exclude pneumothorax but highly favoring MSK etiology, likely muscle spasm.  Providing pain medications listed above, anticipating discharge.  No bowel or bladder dysfunction, no numbness or weakness to the arms or legs, nothing to suggest myelopathy.     X-ray reassuring, patient feeling better, appropriate for discharge.  Barth Kirks. Sedonia Small, MD [place] [email or web address]  Final Clinical Impressions(s) / ED Diagnoses     ICD-10-CM   1. Acute left-sided thoracic back pain  M54.6   2. Muscle spasm  F68.127     ED Discharge Orders         Ordered    naproxen  (NAPROSYN) 500 MG tablet  2 times daily        11/05/19 1236    methocarbamol (ROBAXIN) 500 MG tablet  Every 8 hours PRN        11/05/19 1236           Discharge Instructions Discussed with and Provided to Patient:     Discharge Instructions  You were evaluated in the Emergency Department and after careful evaluation, we did not find any emergent condition requiring admission or further testing in the hospital.  Your exam/testing today is overall reassuring.  Symptoms seem to be due to muscle spasm.  We recommend Tylenol 1000 mg every 4-6 hours.  We also recommend the Naprosyn and Robaxin medications provided as needed.  Recommend light massage and warm compresses.  Please return to the Emergency Department if you experience any worsening of your condition.   Thank you for allowing [place] to be a part of your care.       Maudie Flakes, MD 11/05/19 1300

## 2019-11-05 NOTE — ED Triage Notes (Signed)
Pt presents with c/o upper back pain that started this morning. Pt reports she opened the door to let her cat out and that's when the pain started. Pt ambulatory to triage.

## 2019-11-05 NOTE — Discharge Instructions (Signed)
You were evaluated in the Emergency Department and after careful evaluation, we did not find any emergent condition requiring admission or further testing in the hospital.  Your exam/testing today is overall reassuring.  Symptoms seem to be due to muscle spasm.  We recommend Tylenol 1000 mg every 4-6 hours.  We also recommend the Naprosyn and Robaxin medications provided as needed.  Recommend light massage and warm compresses.  Please return to the Emergency Department if you experience any worsening of your condition.   Thank you for allowing [place] to be a part of your care.

## 2022-04-25 ENCOUNTER — Emergency Department (HOSPITAL_COMMUNITY)
Admission: EM | Admit: 2022-04-25 | Discharge: 2022-04-26 | Disposition: A | Discharge status: Home / Self Care | Payer: Self-pay | Attending: Emergency Medicine | Admitting: Emergency Medicine

## 2022-04-25 ENCOUNTER — Encounter (HOSPITAL_COMMUNITY): Payer: Self-pay

## 2022-04-25 DIAGNOSIS — W57XXXA Bitten or stung by nonvenomous insect and other nonvenomous arthropods, initial encounter: Secondary | ICD-10-CM | POA: Insufficient documentation

## 2022-04-25 DIAGNOSIS — S30861A Insect bite (nonvenomous) of abdominal wall, initial encounter: Secondary | ICD-10-CM | POA: Insufficient documentation

## 2022-04-25 NOTE — ED Triage Notes (Signed)
Pt states that she was bitten by something on her L lower abdomen. No fevers, redness and swelling to area

## 2022-04-26 MED ORDER — DOXYCYCLINE HYCLATE 100 MG PO CAPS: 100.0000 mg | ORAL_CAPSULE | Freq: Two times a day (BID) | ORAL | 0 refills | Status: AC

## 2022-04-26 MED ORDER — DOXYCYCLINE HYCLATE 100 MG PO TABS
100.0000 mg | ORAL_TABLET | Freq: Once | ORAL | Status: AC
  Administered 2022-04-26: 100 mg via ORAL
  Filled 2022-04-26: qty 1

## 2022-04-26 NOTE — ED Provider Notes (Signed)
  [facility] Emergency Department Provider Note MRN:  [id number]  Arrival date & time: 04/26/22     Chief Complaint   Insect Bite   History of Present Illness   Lindsey Sparks is a 42 y.o. year-old female presents to the ED with chief complaint of insect bite.  States that she noticed the bite a few days ago.  States that she might have been bitten while out in the woods.  She does landscaping.  She denies fevers.  States that she drained a little bit of pus.  States that it feels hard and turned red.  History provided by patient.   Review of Systems  Pertinent positive and negative review of systems noted in HPI.    Physical Exam   Vitals:   04/25/22 2236  BP: 138/87  Pulse: (!) 106  Resp: 18  Temp: 97.8 F (36.6 C)  SpO2: 98%    CONSTITUTIONAL:  non toxic-appearing, NAD NEURO:  Alert and oriented x 3, CN 3-12 grossly intact EYES:  eyes equal and reactive ENT/NECK:  Supple, no stridor  CARDIO:  mildly tachycardic, regular rhythm, appears well-perfused  PULM:  No respiratory distress,  GI/GU:  non-distended,  MSK/SPINE:  No gross deformities, no edema, moves all extremities  SKIN:  mildly erythematous indurated skin on the left abdominal wall ~ 2x2 cm, doesn't appear abscessed   *Additional and/or pertinent findings included in MDM below  Diagnostic and Interventional Summary    EKG Interpretation  Date/Time:    Ventricular Rate:    PR Interval:    QRS Duration:   QT Interval:    QTC Calculation:   R Axis:     Text Interpretation:         Labs Reviewed - No data to display  No orders to display    Medications  doxycycline (VIBRA-TABS) tablet 100 mg (100 mg Oral Given 04/26/22 0215)     Procedures  /  Critical Care Procedures  ED Course and Medical Decision Making  I have reviewed the triage vital signs, the nursing notes, and pertinent available records from the EMR.  Social Determinants Affecting Complexity of  Care: Patient has no clinically significant social determinants affecting this chief complaint..   ED Course:    Medical Decision Making Patient here with insect bite.  Looks like she has developing infection, but doesn't appear abscessed.  Risk Prescription drug management.     Consultants: No consultations were needed in caring for this patient.   Treatment and Plan: Emergency department workup does not suggest an emergent condition requiring admission or immediate intervention beyond  what has been performed at this time. The patient is safe for discharge and has  been instructed to return immediately for worsening symptoms, change in  symptoms or any other concerns    Final Clinical Impressions(s) / ED Diagnoses     ICD-10-CM   1. Insect bite of abdominal wall, initial encounter  N82.956O    W57.[facility]       ED Discharge Orders          Ordered    doxycycline (VIBRAMYCIN) 100 MG capsule  2 times daily        04/26/22 0219              Discharge Instructions Discussed with and Provided to Patient:   Discharge Instructions   None      Roxy Horseman, PA-C 04/26/22 0231    Mesner, Barbara Cower, MD 04/26/22 2326

## 2023-08-26 ENCOUNTER — Encounter (HOSPITAL_COMMUNITY): Payer: Self-pay

## 2023-08-26 ENCOUNTER — Emergency Department (HOSPITAL_COMMUNITY)
Admission: EM | Admit: 2023-08-26 | Discharge: 2023-08-26 | Disposition: A | Discharge status: Home / Self Care | Payer: Self-pay | Attending: Emergency Medicine | Admitting: Emergency Medicine

## 2023-08-26 ENCOUNTER — Emergency Department (HOSPITAL_COMMUNITY): Payer: Self-pay

## 2023-08-26 ENCOUNTER — Other Ambulatory Visit: Payer: Self-pay

## 2023-08-26 DIAGNOSIS — Z23 Encounter for immunization: Secondary | ICD-10-CM | POA: Insufficient documentation

## 2023-08-26 DIAGNOSIS — S61217A Laceration without foreign body of left little finger without damage to nail, initial encounter: Secondary | ICD-10-CM | POA: Insufficient documentation

## 2023-08-26 DIAGNOSIS — Y990 Civilian activity done for income or pay: Secondary | ICD-10-CM | POA: Insufficient documentation

## 2023-08-26 DIAGNOSIS — Y93F2 Activity, caregiving, lifting: Secondary | ICD-10-CM | POA: Insufficient documentation

## 2023-08-26 DIAGNOSIS — W208XXA Other cause of strike by thrown, projected or falling object, initial encounter: Secondary | ICD-10-CM | POA: Insufficient documentation

## 2023-08-26 MED ORDER — LIDOCAINE HCL (PF) 1 % IJ SOLN
10.0000 mL | Freq: Once | INTRAMUSCULAR | Status: AC
  Administered 2023-08-26: 5 mL
  Filled 2023-08-26: qty 30

## 2023-08-26 MED ORDER — TETANUS-DIPHTH-ACELL PERTUSSIS 5-2.5-18.5 LF-MCG/0.5 IM SUSY
0.5000 mL | PREFILLED_SYRINGE | Freq: Once | INTRAMUSCULAR | Status: AC
  Administered 2023-08-26: 0.5 mL via INTRAMUSCULAR
  Filled 2023-08-26: qty 0.5

## 2023-08-26 NOTE — ED Triage Notes (Addendum)
 Pt came in after a latter fell on her left pinky earlier today. The bleeding has slowed down from her finger and she has not had an updated tetanus shot. Pt also stated she could not bend her finger.

## 2023-08-26 NOTE — Discharge Instructions (Signed)
 As we discussed, the laceration was repaired with sutures today.  This needs to come out in 7 to 10 days.  You can take Tylenol  ibuprofen  for pain.  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  Return to the emergency department sooner for any worsening symptoms.

## 2023-08-26 NOTE — ED Provider Notes (Signed)
 [facility] AT [facility] Provider Note   CSN: [id number] Arrival date & time: 08/26/23  1729     Patient presents with: Finger Injury   Raechell M Sachdeva is a 43 y.o. female patient who presents to the emergency department today for further evaluation of right fifth finger pain after dropping a ladder on it just prior to arrival.  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  She is complaining of pain and bleeding just lateral to the left fifth nail.  Tetanus not up-to-date.   HPI     Prior to Admission medications   Medication Sig Start Date End Date Taking? Authorizing Provider  acetaminophen  (TYLENOL ) 500 MG tablet Take 1,000 mg by mouth every 6 (six) hours as needed for mild pain, moderate pain, fever or headache.    [provider]  doxycycline  (VIBRAMYCIN ) 100 MG capsule Take 1 capsule (100 mg total) by mouth 2 (two) times daily. 04/26/22   Vicky Charleston, PA-C  ibuprofen  (ADVIL ,MOTRIN ) 200 MG tablet Take 800 mg by mouth every 8 (eight) hours as needed for moderate pain.    [provider]  ibuprofen  (ADVIL ,MOTRIN ) 800 MG tablet Take 1 tablet (800 mg total) by mouth 3 (three) times daily. Patient not taking: Reported on 11/05/2019 05/01/17   Palumbo, April, MD  methocarbamol  (ROBAXIN ) 500 MG tablet Take 1 tablet (500 mg total) by mouth every 8 (eight) hours as needed for muscle spasms. 11/05/19   Theadore Ozell HERO, MD  naproxen  (NAPROSYN ) 500 MG tablet Take 1 tablet (500 mg total) by mouth 2 (two) times daily. 11/05/19   Theadore Ozell HERO, MD  traMADol  (ULTRAM ) 50 MG tablet Take 1 tablet (50 mg total) by mouth every 6 (six) hours as needed. Patient not taking: Reported on 11/05/2019 08/24/17   Laurice Maude BROCKS, MD    Allergies: Patient has no known allergies.    Review of Systems  All other systems  reviewed and are negative.   Updated Vital Signs BP (!) 143/102 (BP Location: Right Arm)   Pulse (!) 107   Temp 98.1 F (36.7 C) (Oral)   Resp 18   Ht 5' 1 (1.549 m)   Wt 46.7 kg   SpO2 98%   BMI 19.46 kg/m   Physical Exam Vitals and nursing note reviewed.  Constitutional:      Appearance: Normal appearance.  HENT:     Head: Normocephalic and atraumatic.  Eyes:     General:        Right eye: No discharge.        Left eye: No discharge.     Conjunctiva/sclera: Conjunctivae normal.  Pulmonary:     Effort: Pulmonary effort is normal.  Musculoskeletal:       Hands:  Skin:    General: Skin is warm and dry.     Findings: No rash.  Neurological:     General: No focal deficit present.     Mental Status: She is alert.  Psychiatric:        Mood and Affect: Mood normal.        Behavior: Behavior normal.     (all labs ordered are listed, but only abnormal results are displayed) Labs Reviewed - No data to display  EKG: None  Radiology: DG Finger Little Left Result Date: 08/26/2023 CLINICAL DATA:  Left fifth finger injury after a ladder fell onto the patient. Bleeding. Decreased range of motion. EXAM: LEFT FINGER(S) - 2+ VIEW COMPARISON:  None Available. FINDINGS: The finger is flexed. This may indicate patient positioning or ligamentous injury. No evidence of acute fracture or dislocation. No focal bone lesion or bone destruction. No radiopaque soft tissue foreign bodies. IMPRESSION: Flexion of the finger at the proximal interphalangeal joint may be due to patient positioning or ligamentous injury. No acute displaced fractures identified. Electronically Signed   By: Elsie Gravely M.D.   On: 08/26/2023 18:25     .Laceration Repair  Date/Time: 08/26/2023 6:58 PM  Performed by: Theotis Cameron HERO, PA-C Authorized by: Theotis Cameron HERO, PA-C   Consent:    Consent obtained:  Verbal   Consent given by:  Patient   Risks, benefits, and alternatives were discussed: yes      Risks discussed:  Infection and pain   Alternatives discussed:  No treatment Universal protocol:    Procedure explained and questions answered to patient or proxy's satisfaction: yes     Relevant documents present and verified: yes     Test results available: yes     Imaging studies available: yes     Required blood products, implants, devices, and special equipment available: yes     Site/side marked: yes     Immediately prior to procedure, a time out was called: no     Patient identity confirmed:  Verbally with patient and arm band Anesthesia:    Anesthesia method:  Local infiltration   Local anesthetic:  Lidocaine  1% w/o epi Laceration details:    Location:  Finger   Finger location:  L small finger   Length (cm):  1.5 Pre-procedure details:    Preparation:  Patient was prepped and draped in usual sterile fashion Exploration:    Limited defect created (wound extended): no     Hemostasis achieved with:  Direct pressure   Imaging obtained: x-ray     Imaging outcome: foreign body not noted     Wound exploration: wound explored through full range of motion     Wound extent: areolar tissue violated     Wound extent: fascia not violated and no foreign body     Contaminated: no   Treatment:    Area cleansed with:  Povidone-iodine   Amount of cleaning:  Standard   Debridement:  None   Undermining:  None Skin repair:    Repair method:  Sutures   Suture size:  4-0   Suture material:  Prolene   Suture technique:  Simple interrupted   Number of sutures:  1 Approximation:    Approximation:  Close Repair type:    Repair type:  Simple Post-procedure details:    Procedure completion:  Tolerated well, no immediate complications    Medications Ordered in the ED  Tdap (BOOSTRIX) injection 0.5 mL (0.5 mLs Intramuscular Given 08/26/23 1828)  lidocaine  (PF) (XYLOCAINE ) 1 % injection 10 mL (5 mLs Other Given by Other 08/26/23 1841)     Medical Decision Making Neveen M Sabet is a  43 y.o. female patient who presents to the emergency department today for further evaluation of a left finger injury.  Imaging was negative for any fracture.  I performed a digital block at the bedside which provided appropriate analgesic control.  I explored the wound fully.  I placed 1 simple interrupted suture.  Some of the skin may end up dying as it was cut by the nail from  the pressure of the ladder.  Nail and nailbed are still intact.  Sutures will come out in 7 to 10 days.  Thoroughly irrigated and rinsed with iodine.  Strict return precautions were discussed.  Tylenol  and/or ibuprofen  for pain control.  She is safe for discharge.   Amount and/or Complexity of Data Reviewed Radiology: ordered.  Risk Prescription drug management.     Final diagnoses:  Laceration of left little finger without foreign body without damage to nail, initial encounter    ED Discharge Orders     None          Theotis Cameron CHRISTELLA DEVONNA 08/26/23 1903    Patsey Lot, MD 08/26/23 2249

## 2023-08-26 NOTE — ED Notes (Signed)
 [facility]
